# Patient Record
Sex: Male | Born: 1955
Health system: Southern US, Community
[De-identification: ages and names within clinical notes are randomized; demographics above are authoritative.]

## PROBLEM LIST (undated history)

## (undated) DIAGNOSIS — R251 Tremor, unspecified: Secondary | ICD-10-CM

## (undated) DIAGNOSIS — F1910 Other psychoactive substance abuse, uncomplicated: Secondary | ICD-10-CM

## (undated) DIAGNOSIS — Z87820 Personal history of traumatic brain injury: Secondary | ICD-10-CM

## (undated) DIAGNOSIS — Z8719 Personal history of other diseases of the digestive system: Secondary | ICD-10-CM

## (undated) DIAGNOSIS — Z86010 Personal history of colonic polyps: Secondary | ICD-10-CM

## (undated) DIAGNOSIS — F1010 Alcohol abuse, uncomplicated: Secondary | ICD-10-CM

## (undated) DIAGNOSIS — I10 Essential (primary) hypertension: Secondary | ICD-10-CM

## (undated) DIAGNOSIS — K759 Inflammatory liver disease, unspecified: Secondary | ICD-10-CM

## (undated) DIAGNOSIS — M5030 Other cervical disc degeneration, unspecified cervical region: Secondary | ICD-10-CM

## (undated) DIAGNOSIS — M1990 Unspecified osteoarthritis, unspecified site: Secondary | ICD-10-CM

## (undated) DIAGNOSIS — K648 Other hemorrhoids: Secondary | ICD-10-CM

## (undated) DIAGNOSIS — G8929 Other chronic pain: Secondary | ICD-10-CM

## (undated) DIAGNOSIS — T7840XA Allergy, unspecified, initial encounter: Secondary | ICD-10-CM

## (undated) DIAGNOSIS — Z8781 Personal history of (healed) traumatic fracture: Secondary | ICD-10-CM

## (undated) DIAGNOSIS — E785 Hyperlipidemia, unspecified: Secondary | ICD-10-CM

## (undated) DIAGNOSIS — Z973 Presence of spectacles and contact lenses: Secondary | ICD-10-CM

## (undated) DIAGNOSIS — F419 Anxiety disorder, unspecified: Secondary | ICD-10-CM

## (undated) DIAGNOSIS — K5790 Diverticulosis of intestine, part unspecified, without perforation or abscess without bleeding: Secondary | ICD-10-CM

## (undated) DIAGNOSIS — M545 Low back pain, unspecified: Secondary | ICD-10-CM

## (undated) DIAGNOSIS — S0990XA Unspecified injury of head, initial encounter: Secondary | ICD-10-CM

## (undated) DIAGNOSIS — R7989 Other specified abnormal findings of blood chemistry: Secondary | ICD-10-CM

## (undated) DIAGNOSIS — C61 Malignant neoplasm of prostate: Secondary | ICD-10-CM

## (undated) DIAGNOSIS — R945 Abnormal results of liver function studies: Secondary | ICD-10-CM

## (undated) HISTORY — PX: DENTAL SURGERY: SHX609

## (undated) HISTORY — PX: EYE SURGERY: SHX253

## (undated) HISTORY — DX: Other cervical disc degeneration, unspecified cervical region: M50.30

## (undated) HISTORY — DX: Hyperlipidemia, unspecified: E78.5

## (undated) HISTORY — DX: Allergy, unspecified, initial encounter: T78.40XA

## (undated) HISTORY — DX: Other psychoactive substance abuse, uncomplicated: F19.10

## (undated) HISTORY — PX: IRRIGATION AND DEBRIDEMENT SEBACEOUS CYST: SHX5255

## (undated) HISTORY — DX: Anxiety disorder, unspecified: F41.9

## (undated) HISTORY — DX: Unspecified osteoarthritis, unspecified site: M19.90

---

## 1977-07-17 DIAGNOSIS — Z87820 Personal history of traumatic brain injury: Secondary | ICD-10-CM

## 1977-07-17 HISTORY — DX: Personal history of traumatic brain injury: Z87.820

## 1977-10-05 DIAGNOSIS — S0990XA Unspecified injury of head, initial encounter: Secondary | ICD-10-CM

## 1977-10-05 HISTORY — DX: Unspecified injury of head, initial encounter: S09.90XA

## 2004-07-05 ENCOUNTER — Ambulatory Visit (HOSPITAL_BASED_OUTPATIENT_CLINIC_OR_DEPARTMENT_OTHER): Admission: RE | Admit: 2004-07-05 | Discharge: 2004-07-05 | Payer: Self-pay | Admitting: Orthopedic Surgery

## 2004-07-05 ENCOUNTER — Ambulatory Visit (HOSPITAL_COMMUNITY): Admission: RE | Admit: 2004-07-05 | Discharge: 2004-07-05 | Payer: Self-pay | Admitting: Orthopedic Surgery

## 2004-11-23 ENCOUNTER — Emergency Department (HOSPITAL_COMMUNITY): Admission: EM | Admit: 2004-11-23 | Discharge: 2004-11-23 | Payer: Self-pay | Admitting: Emergency Medicine

## 2005-08-28 ENCOUNTER — Emergency Department (HOSPITAL_COMMUNITY): Admission: EM | Admit: 2005-08-28 | Discharge: 2005-08-28 | Payer: Self-pay | Admitting: Emergency Medicine

## 2005-09-11 ENCOUNTER — Encounter: Admission: RE | Admit: 2005-09-11 | Discharge: 2005-09-11 | Payer: Self-pay | Admitting: Family Medicine

## 2007-10-22 ENCOUNTER — Encounter: Admission: RE | Admit: 2007-10-22 | Discharge: 2007-10-22 | Payer: Self-pay | Admitting: Family Medicine

## 2007-10-22 IMAGING — CR DG KNEE 1-2V*L*
3 series · 3 of 3 positions shown · non-contrast
Comparison: None

CLINICAL DATA: Chronic pain, no acute injury

LEFT KNEE - 1-2 VIEW

[view not recorded (1 of 3)]
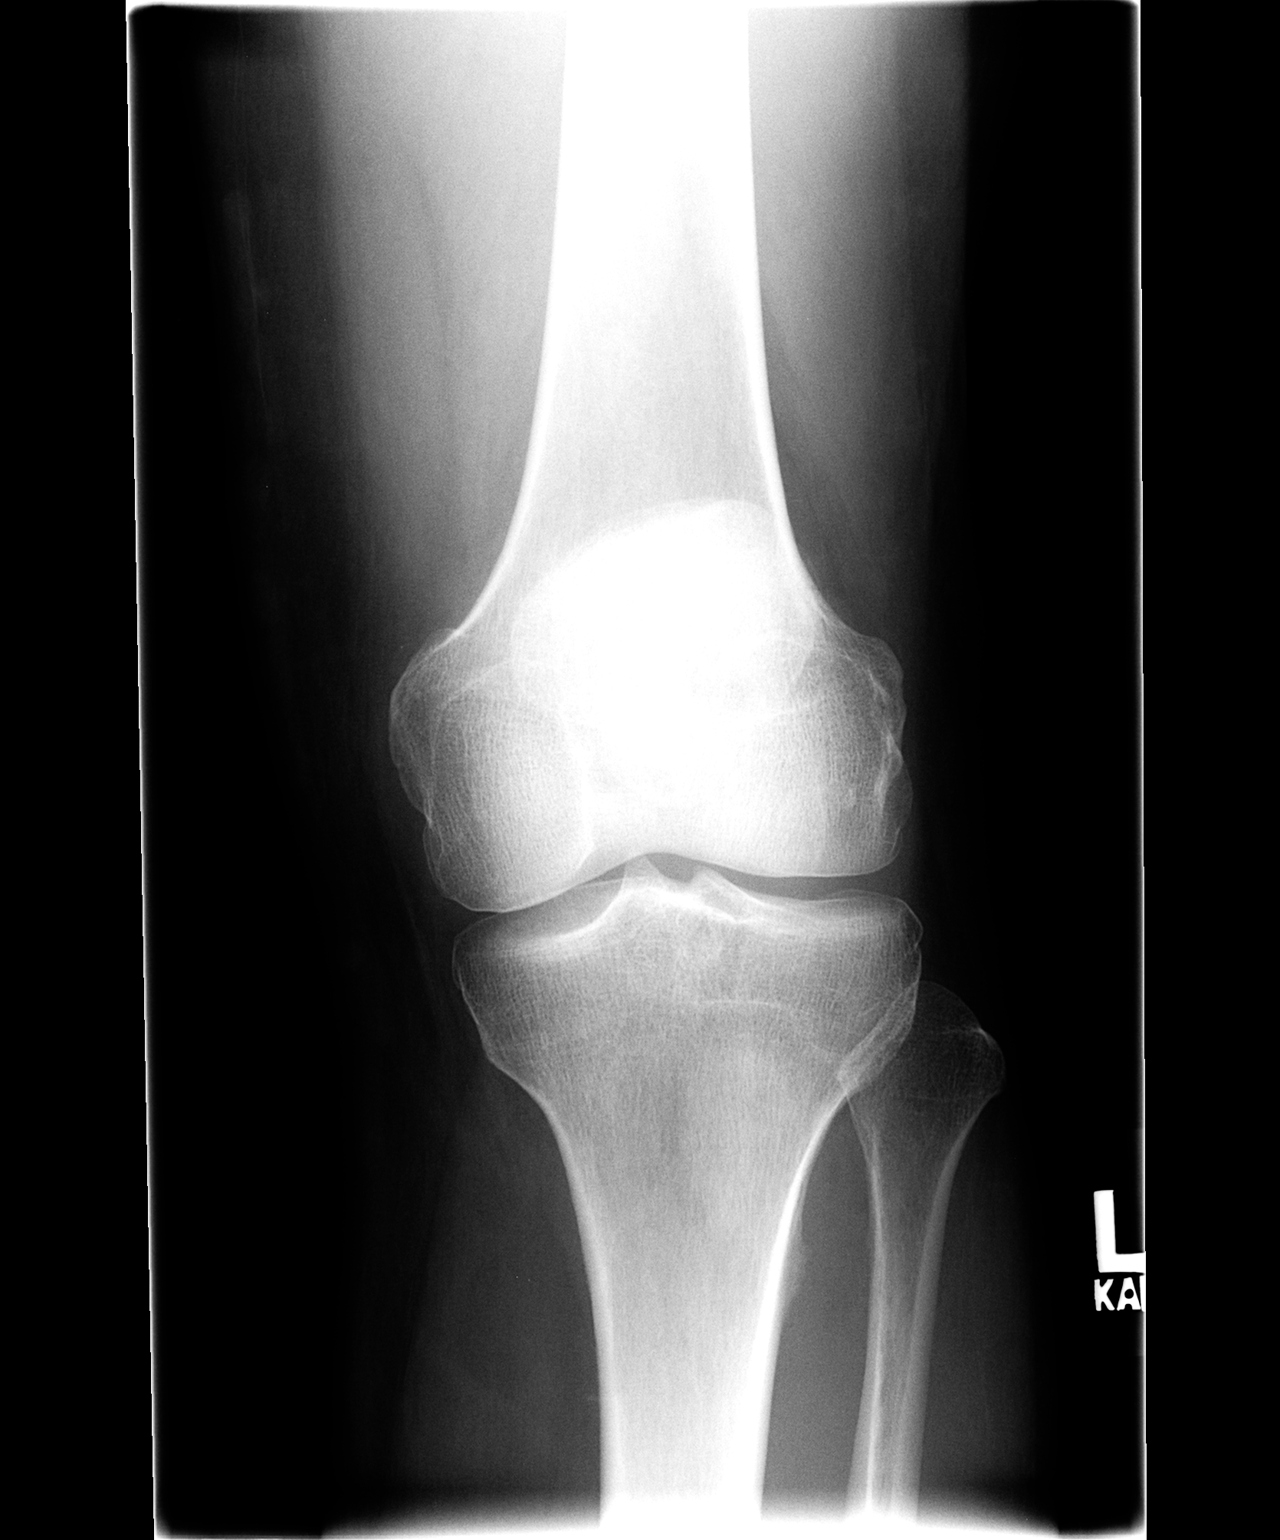

[view not recorded (2 of 3)]
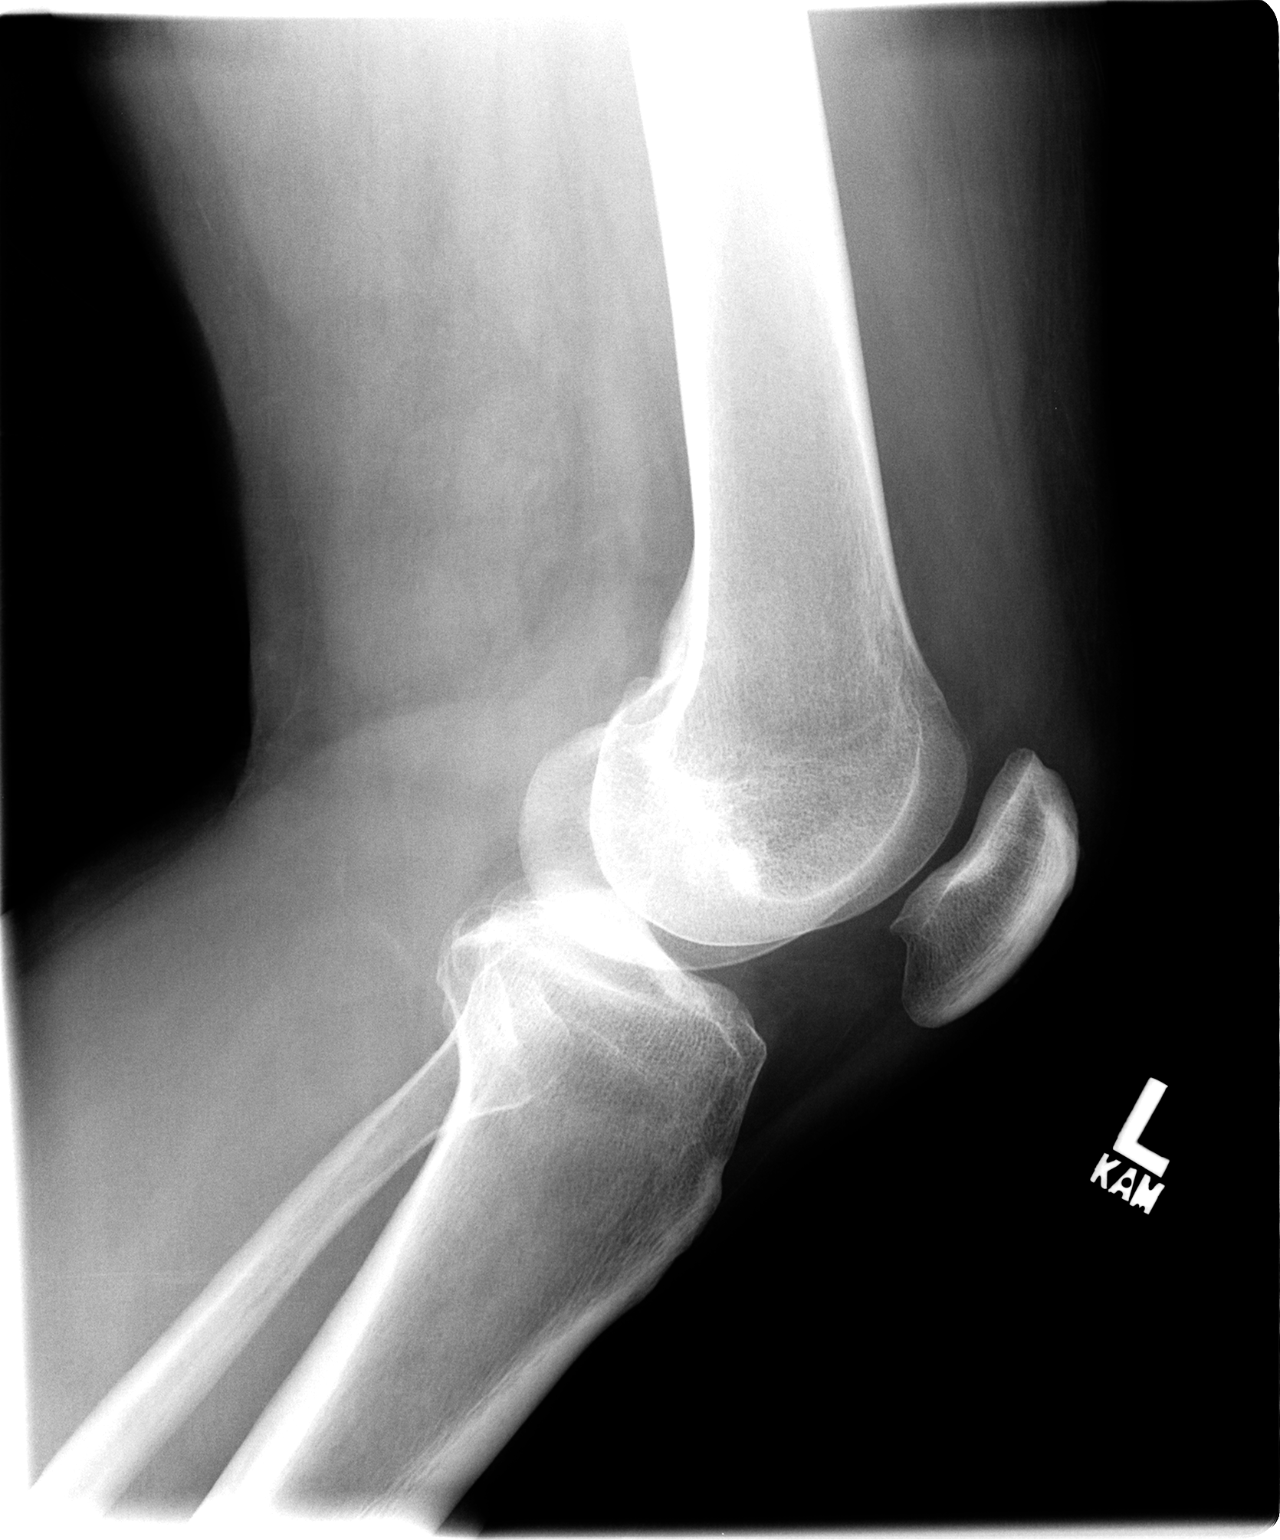

[view not recorded (3 of 3)]
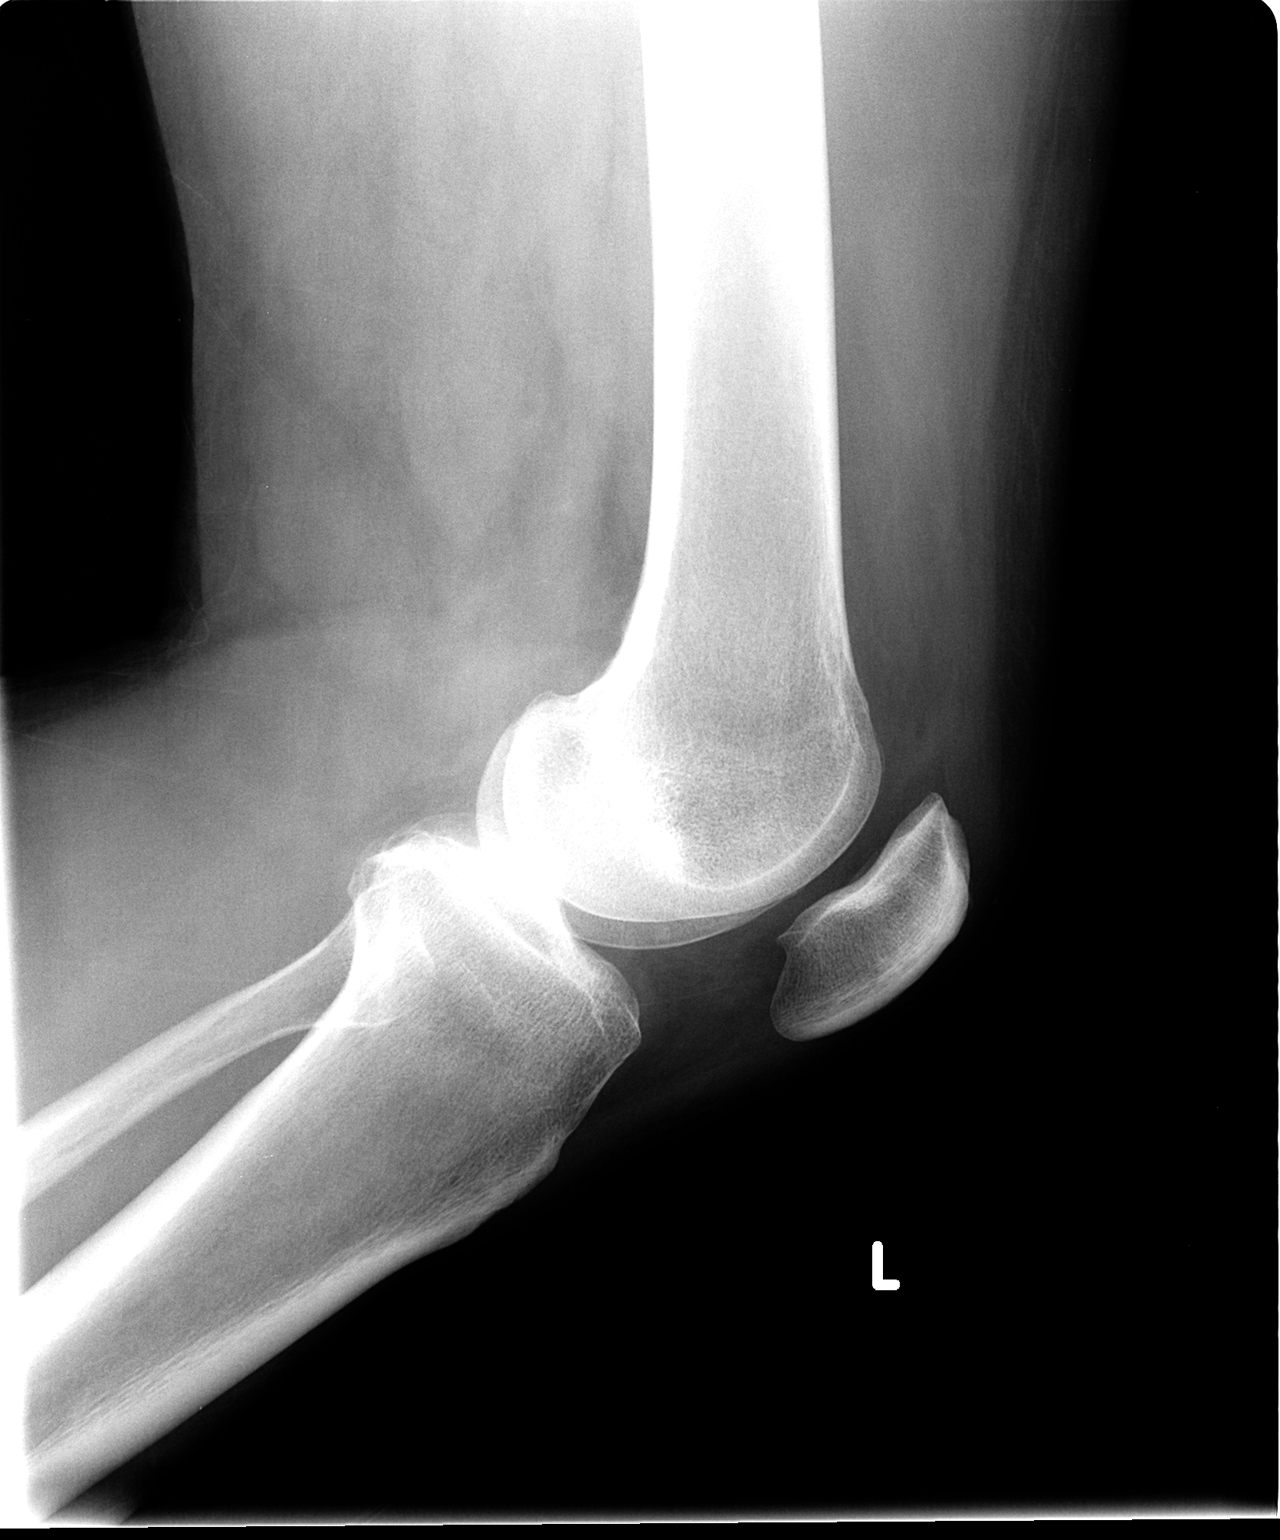

[3 of 3 positions shown; findings below may reference images not displayed]

FINDINGS: No acute fractures seen and no effusion is noted.  Joint
spaces are normal.
IMPRESSION: Negative.

## 2010-08-24 ENCOUNTER — Emergency Department (HOSPITAL_COMMUNITY)
Admission: EM | Admit: 2010-08-24 | Discharge: 2010-08-24 | Disposition: A | Discharge status: Home / Self Care | Payer: Self-pay | Attending: Emergency Medicine | Admitting: Emergency Medicine

## 2010-08-24 DIAGNOSIS — I10 Essential (primary) hypertension: Secondary | ICD-10-CM | POA: Insufficient documentation

## 2010-08-24 DIAGNOSIS — R0682 Tachypnea, not elsewhere classified: Secondary | ICD-10-CM | POA: Insufficient documentation

## 2010-08-24 DIAGNOSIS — F1010 Alcohol abuse, uncomplicated: Secondary | ICD-10-CM | POA: Insufficient documentation

## 2010-08-24 LAB — DIFFERENTIAL
Monocytes Absolute: 0.4 10*3/uL (ref 0.1–1.0)
Monocytes Relative: 6 % (ref 3–12)
Neutrophils Relative %: 57 % (ref 43–77)

## 2010-08-24 LAB — COMPREHENSIVE METABOLIC PANEL
ALT: 58 U/L — ABNORMAL HIGH (ref 0–53)
Albumin: 4.4 g/dL (ref 3.5–5.2)
BUN: 12 mg/dL (ref 6–23)
CO2: 26 mEq/L (ref 19–32)
GFR calc Af Amer: >60 mL/min (ref >60)
GFR calc non Af Amer: >60 mL/min (ref >60)
Glucose, Bld: 89 mg/dL (ref 70–99)

## 2010-08-24 LAB — CBC
HCT: 43.6 % (ref 39.0–52.0)
Hemoglobin: 15 g/dL (ref 13.0–17.0)
MCH: 34.2 pg — ABNORMAL HIGH (ref 26.0–34.0)
MCHC: 34.4 g/dL (ref 30.0–36.0)
RBC: 4.39 MIL/uL (ref 4.22–5.81)
WBC: 6.9 10*3/uL (ref 4.0–10.5)

## 2010-08-24 LAB — URINALYSIS, ROUTINE W REFLEX MICROSCOPIC
Ketones, ur: NEGATIVE mg/dL
Protein, ur: NEGATIVE mg/dL
Urine Glucose, Fasting: NEGATIVE mg/dL
pH: 6 (ref 5.0–8.0)

## 2010-08-24 LAB — RAPID URINE DRUG SCREEN, HOSP PERFORMED
Barbiturates: NOT DETECTED
Benzodiazepines: NOT DETECTED
Cocaine: NOT DETECTED
Tetrahydrocannabinol: NOT DETECTED

## 2010-08-24 LAB — ETHANOL: Alcohol, Ethyl (B): 65 mg/dL — ABNORMAL HIGH (ref 0–10)

## 2010-12-02 NOTE — Op Note (Signed)
NAMEMALIKE, FOGLIO              ACCOUNT NO.:  [id number]   MEDICAL RECORD NO.:  [id number]          PATIENT TYPE:  AMB   LOCATION:  DSC                          FACILITY:  [facility]   PHYSICIAN:  Katy Fitch. 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   OPERATION:  Release of left transverse carpal ligament.   SURGEON:  Katy Fitch. Sypher, M.D.   ASSISTANT:  Marveen Reeks. Dasnoit, P.A.C.   ANESTHESIA:  General by LMA.   SUPERVISING ANESTHESIOLOGIST:  Sheldon Silvan, M.D.   INDICATIONS FOR PROCEDURE:  [name] is a 55 year old gentleman with a  history of severe bilateral chronic carpal tunnel syndrome.  Electrodiagnostic studies performed in April of 2004 documented significant  bilateral carpal tunnel syndrome.   We have been treating him for the past 18 months with steroid injections and  splinting.  He has responded transiently but not permanently.  He has now  requested that we release his left transverse carpal ligament.   After informed consent he is brought to the operating room at this time.   DESCRIPTION OF PROCEDURE:  Erric Machnik is brought to the operating room  and placed in supine position on the operating table.  Following induction  of general anesthesia by LMA, the left arm was prepped with Betadine soap  and solution and sterilely draped.  Following exsanguination of the limb  with Esmarch bandage, an arterial tourniquet on proximal brachium was  inflated to 240 mmHg.   Procedure commenced with a short incision in line of the ring finger and the  palm.  Subcutaneous tissue was carefully divided over palmar fascia.  This  split longitudinally through the common sensory branch of the median  nerve.  These were followed back to the transverse carpal ligament which was  carefully isolated from the median nerve.  The ligament was released along  its ulnar border extending into the distal forearm.   This widely opened carpal canal.  No masses or other predicaments were  noted.  Bleeding points along the margin of the released ligament were  electrocauterized with bipolar current followed by repair of the skin with  intradermal 3-0 Prolene.  A compressive dressing was applied with volar  plaster splint, maintaining the wrist in five degrees dorsiflexion.   For aftercare, Mr. Dapolito was given a prescription for Vicodin 5 mg one or  two tablets p.o. q.4-6h. p.r.n. pain 20 tablets without refill.      Robe   RVS/MEDQ  D:  07/05/2004  T:  07/06/2004  Job:  [id number]

## 2013-01-22 ENCOUNTER — Ambulatory Visit (INDEPENDENT_AMBULATORY_CARE_PROVIDER_SITE_OTHER): Payer: BC Managed Care – PPO | Admitting: Family Medicine

## 2013-01-22 DIAGNOSIS — S0100XA Unspecified open wound of scalp, initial encounter: Secondary | ICD-10-CM

## 2013-01-22 DIAGNOSIS — F1020 Alcohol dependence, uncomplicated: Secondary | ICD-10-CM

## 2013-01-22 DIAGNOSIS — S0990XA Unspecified injury of head, initial encounter: Secondary | ICD-10-CM

## 2013-01-22 NOTE — Progress Notes (Signed)
Subjective: Patient drinks alcohol regularly. Last night he had a year and a couple of shots of liquor and a Xanax to help him go to bed. He does not sleep in the same bedroom with his wife. He awakened this morning with a wound on the left side of his scalp. He thinks medicine going to the bathroom and fallen.  Objective: Still can smell alcohol on him though he says he is not anything since last night. Approximately 1 inch wound in his left lateral scalp. He is alert and oriented. His TMs are normal. Eyes normal. Throat clear. Neck supple without nodes or tenderness. He has a bruise on his left upper arm. Chest clear. Heart regular without murmurs.  Assessment: Wound forehead Alcoholism  Plan: Told him Not stated benzodiazepines and alcohol together. Staples placed by a PA Think he is okay since it is been 12 hours or so since his head injury. No for the testing ordered.

## 2013-01-22 NOTE — Progress Notes (Signed)
   Patient ID: NESHAWN AIRD MRN: [id number], DOB: October 21, 1955, 57 y.o. Date of Encounter: 01/22/2013, 4:29 PM   PROCEDURE NOTE: Verbal consent obtained.  Risks and benefits of the procedure were explained. Patient made an informed decision to proceed with the procedure. Numbing: Anesthesia obtained with 2% lidocaine with epi 2 cc for local anesthesia.   Cleansed with soap and water by physician. Irrigated. Betadine prep per usual protocol.  Wound explored, no deep structures involved, no foreign bodies.   Wound repaired with # 6 staples.  Hemostasis obtained. Wound cleansed.  Wound care instructions including precautions covered with patient. Handout given.  Anticipate suture removal in 5 days.  SignedEula Listen, PA-C 01/22/2013 4:29 PM

## 2013-01-27 ENCOUNTER — Ambulatory Visit (INDEPENDENT_AMBULATORY_CARE_PROVIDER_SITE_OTHER): Payer: BC Managed Care – PPO | Admitting: Physician Assistant

## 2013-01-27 VITALS — BP 132/78 | Temp 98.7°F

## 2013-01-27 DIAGNOSIS — Z5189 Encounter for other specified aftercare: Secondary | ICD-10-CM

## 2013-01-27 DIAGNOSIS — S0101XD Laceration without foreign body of scalp, subsequent encounter: Secondary | ICD-10-CM

## 2013-01-27 NOTE — Progress Notes (Signed)
   [address], [place] [address]   Phone [phone]  Subjective:    Patient ID: [name], male    DOB: 28-Nov-1955, 57 y.o.   MRN: [id number]  HPI Pt presents to clinic for suture removal.  He has some slight TTP around the area.   Review of Systems     Objective:   Physical Exam  Vitals reviewed. Constitutional: He is oriented to person, place, and time. He appears well-developed and well-nourished.  HENT:  Head: Normocephalic and atraumatic.  Right Ear: External ear normal.  Left Ear: External ear normal.  Pulmonary/Chest: Effort normal.  Neurological: He is alert and oriented to person, place, and time.  Skin: Skin is warm and dry.  #6 staples removed without difficulty. Still some swelling around the wound but no signs of infection.  Scab in place.  Psychiatric: He has a normal mood and affect. His behavior is normal. Judgment and thought content normal.          Assessment & Plan:  Wound head - sutures removed without difficulty - would care d/w pt  Benny Lennert PA-C 01/27/2013 3:08 PM

## 2013-02-08 ENCOUNTER — Other Ambulatory Visit (HOSPITAL_COMMUNITY): Payer: Self-pay | Admitting: Family Medicine

## 2013-02-08 ENCOUNTER — Ambulatory Visit (HOSPITAL_COMMUNITY)
Admission: RE | Admit: 2013-02-08 | Discharge: 2013-02-08 | Disposition: A | Discharge status: Home / Self Care | Payer: BC Managed Care – PPO | Source: Ambulatory Visit | Attending: Family Medicine | Admitting: Family Medicine

## 2013-02-08 DIAGNOSIS — R52 Pain, unspecified: Secondary | ICD-10-CM

## 2013-02-08 DIAGNOSIS — R609 Edema, unspecified: Secondary | ICD-10-CM | POA: Insufficient documentation

## 2013-02-08 IMAGING — CR DG FINGER INDEX 2+V*L*
3 series · 3 of 3 positions shown · non-contrast
Comparison: None.

CLINICAL DATA: Pain and swelling; history of fungal infection

LEFT INDEX FINGER 2+V

[x finger pa left]
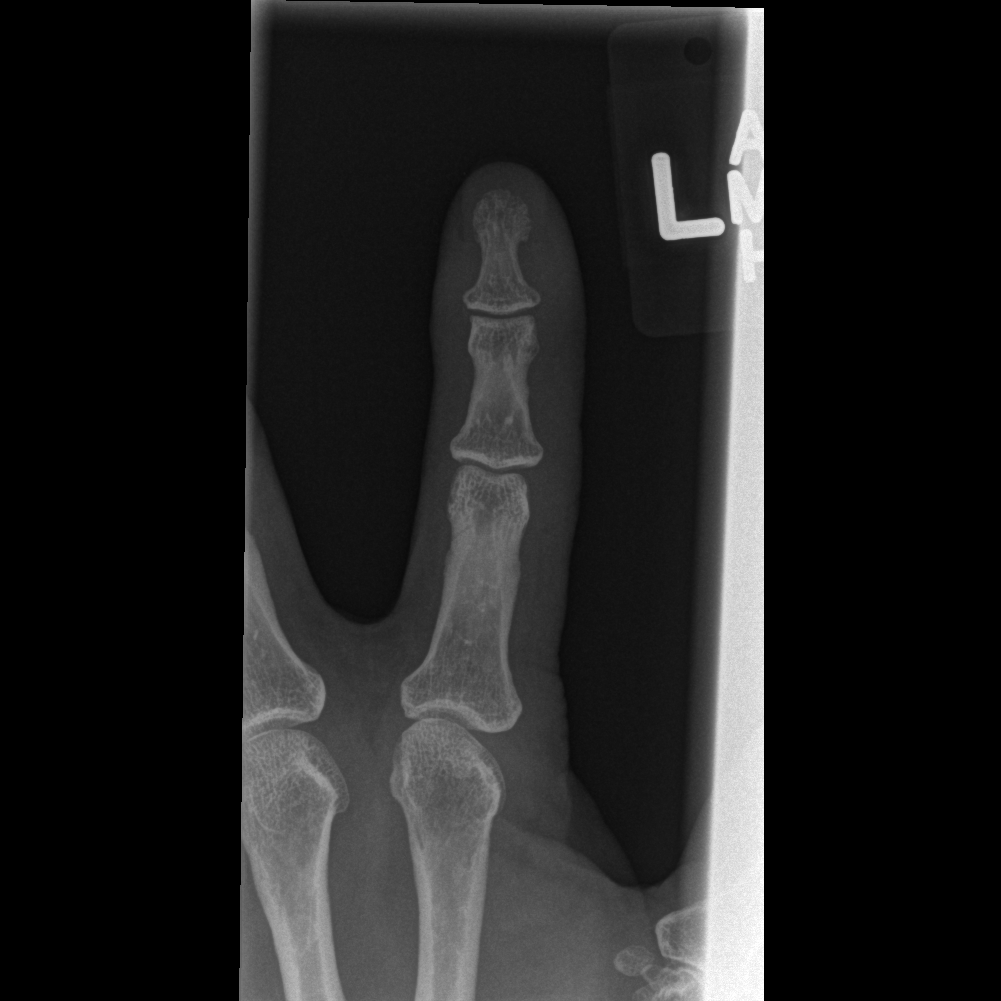

[x finger obl. left]
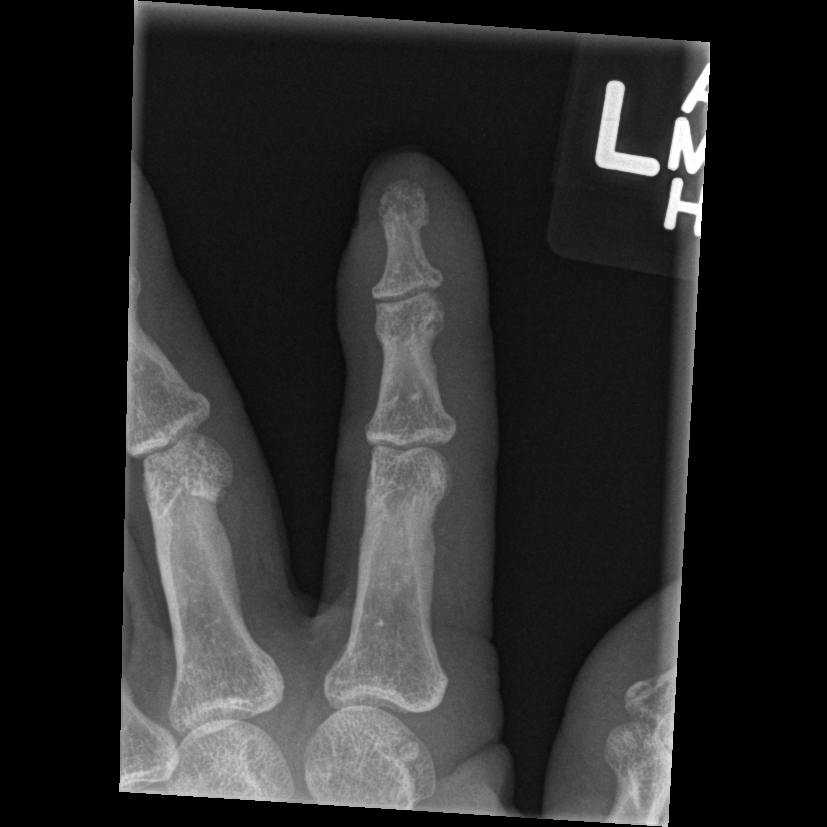

[x finger lateral left]
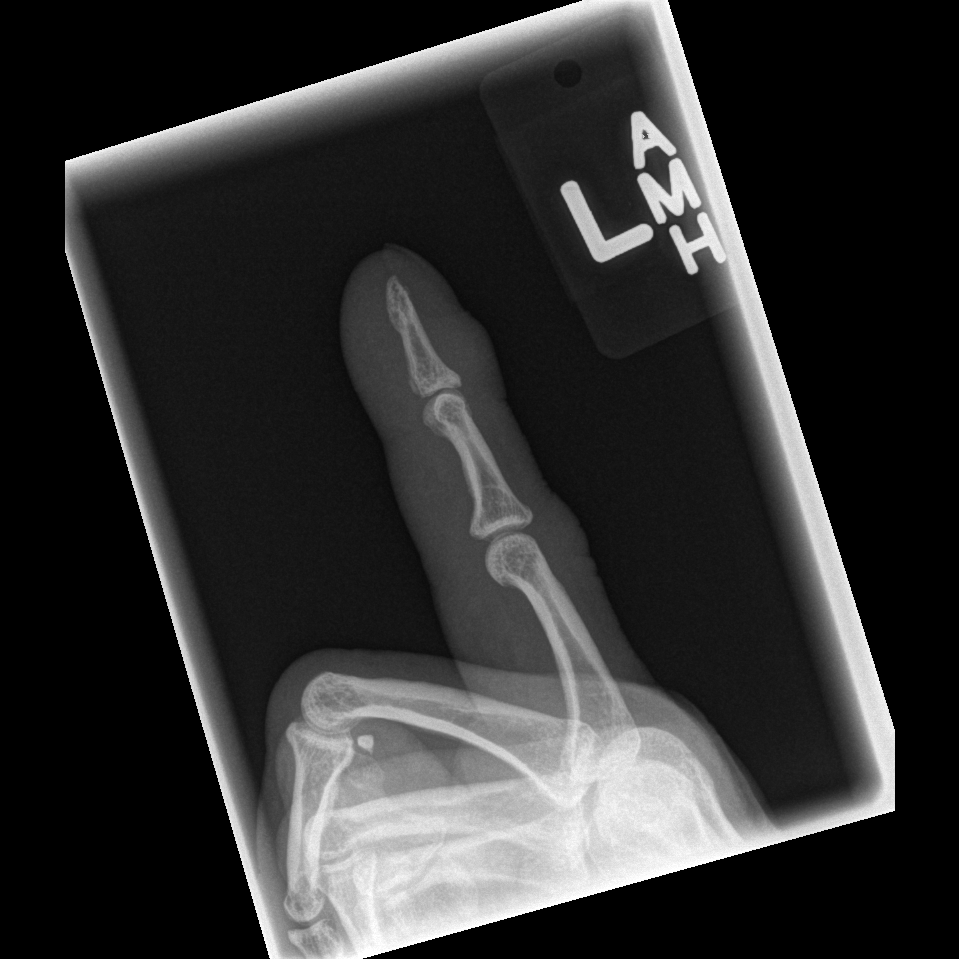

[3 of 3 positions shown; findings below may reference images not displayed]

FINDINGS: Frontal, oblique, and lateral views were obtained.  There
is swelling dorsally at the level of the proximal portion of the
second distal phalanx.  No radiopaque foreign body or soft tissue
air.

No erosive change or bony destruction.  No fracture or dislocation.
Joint spaces appear intact.
IMPRESSION: Soft tissue swelling dorsally.  No fracture or radiopaque foreign
body.  No erosive change or bony destruction.

## 2013-02-09 ENCOUNTER — Ambulatory Visit (INDEPENDENT_AMBULATORY_CARE_PROVIDER_SITE_OTHER): Payer: BC Managed Care – PPO | Admitting: Emergency Medicine

## 2013-02-09 DIAGNOSIS — F1920 Other psychoactive substance dependence, uncomplicated: Secondary | ICD-10-CM | POA: Insufficient documentation

## 2013-02-09 DIAGNOSIS — L03019 Cellulitis of unspecified finger: Secondary | ICD-10-CM

## 2013-02-09 DIAGNOSIS — M25549 Pain in joints of unspecified hand: Secondary | ICD-10-CM

## 2013-02-09 DIAGNOSIS — L02519 Cutaneous abscess of unspecified hand: Secondary | ICD-10-CM

## 2013-02-09 MED ORDER — CIPROFLOXACIN HCL 500 MG PO TABS: 500.0000 mg | ORAL_TABLET | Freq: Two times a day (BID) | ORAL | Status: DC

## 2013-02-09 NOTE — Progress Notes (Signed)
  Subjective:    Patient ID: [name], male    DOB: April 07, 1956, 57 y.o.   MRN: [id number]  HPI 57y.o. Male here today with left 2nd finger infection. States this has been going on for the past few days. Was seen at [facility] and given two shots.   Patient also went to [facility] on Sat 7/26   Review of Systems     Objective:   Physical Exam examination of the right index finger reveals periodic material beneath the nail of his finger. There is significant paronychia present at the base of the nail plate. He does have a flexion extension at the DIP joint. Films were done yesterday at [facility]  these were normal.        Assessment & Plan:  Patient did have an odor of old alcohol in the office. I did talk with him about possibly getting into a treatment program. He has been through detox before in [place]. I told him if he ever wanted to come in and talk about treatment programs I would be more than happy to sit down with him and discuss this with him.

## 2013-02-09 NOTE — Progress Notes (Signed)
   Patient ID: [name] MRN: [id number], DOB: 21-Jun-1956, 57 y.o. Date of Encounter: 02/09/2013, 2:56 PM   PROCEDURE NOTE: Verbal consent obtained.  Risks and benefits explained.  Patient made informed decision under sound mind and judgement. Sterile technique employed. Numbing: Anesthesia obtained with metacarpal block of 2% lidocaine plain.  Betadine prep per usual protocol.  Entire nail lifted without difficulty and removed in total. Proximal aspect of nail bed explored revealing no nail remnants.  Copious amount of purulence expressed.  No active bleeding. Xeroform dressing applied. Wound care instructions including precautions covered with patient. Handout given.  Return in 48 hours for recheck.   Grier Mitts, PA-C 02/09/2013 2:56 PM

## 2013-02-11 ENCOUNTER — Ambulatory Visit (INDEPENDENT_AMBULATORY_CARE_PROVIDER_SITE_OTHER): Payer: BC Managed Care – PPO | Admitting: Physician Assistant

## 2013-02-11 VITALS — BP 120/70 | HR 86 | Temp 98.0°F | Resp 16 | Ht 64.0 in | Wt 160.0 lb

## 2013-02-11 DIAGNOSIS — IMO0002 Reserved for concepts with insufficient information to code with codable children: Secondary | ICD-10-CM

## 2013-02-11 DIAGNOSIS — L03012 Cellulitis of left finger: Secondary | ICD-10-CM

## 2013-02-11 NOTE — Progress Notes (Signed)
   [address], [place] [address]   Phone [phone]  Subjective:    Patient ID: [name], male    DOB: 1955-10-07, 57 y.o.   MRN: [id number]  HPI Pt presents to clinic for wound recheck.  He is doing ok.  Tolerating the abx ok.  He is changing the drsg once daily.  No fevers or chills - he feels fine.   Review of Systems     Objective:   Physical Exam  Vitals reviewed. Constitutional: He is oriented to person, place, and time. He appears well-developed and well-nourished.  HENT:  Head: Normocephalic and atraumatic.  Right Ear: External ear normal.  Left Ear: External ear normal.  Pulmonary/Chest: Effort normal.  Neurological: He is alert and oriented to person, place, and time.  Skin: Skin is warm and dry.  Drsg removed.  Skin macerated at the nail fold - but no erythema or signs of infection.  Xeroform gauze in place.  Psychiatric: He has a normal mood and affect. His behavior is normal. Judgment and thought content normal.   Results for orders placed in visit on 02/09/13  WOUND CULTURE      Result Value Range   GRAM STAIN No WBC Seen     GRAM STAIN No Squamous Epithelial Cells Seen     GRAM STAIN No Organisms Seen     Preliminary Report Culture reincubated for better growth         Assessment & Plan:  Paronychia, left - d/w pt no bacteria but still should complete abx.  Pt to change drsg as soon as it becomes wet due to maceration - No f/u needed if continuing to heal.  Benny Lennert PA-C 02/11/2013 4:32 PM

## 2013-02-14 DIAGNOSIS — Z8781 Personal history of (healed) traumatic fracture: Secondary | ICD-10-CM

## 2013-02-14 HISTORY — DX: Personal history of (healed) traumatic fracture: Z87.81

## 2013-02-14 LAB — WOUND CULTURE: Gram Stain: NONE SEEN

## 2013-03-04 ENCOUNTER — Ambulatory Visit: Payer: BC Managed Care – PPO

## 2013-03-04 ENCOUNTER — Ambulatory Visit (INDEPENDENT_AMBULATORY_CARE_PROVIDER_SITE_OTHER): Payer: BC Managed Care – PPO | Admitting: Internal Medicine

## 2013-03-04 VITALS — BP 142/88 | HR 96 | Temp 98.3°F | Resp 20 | Ht 63.0 in | Wt 169.6 lb

## 2013-03-04 DIAGNOSIS — S90129A Contusion of unspecified lesser toe(s) without damage to nail, initial encounter: Secondary | ICD-10-CM

## 2013-03-04 DIAGNOSIS — Z7189 Other specified counseling: Secondary | ICD-10-CM

## 2013-03-04 DIAGNOSIS — R109 Unspecified abdominal pain: Secondary | ICD-10-CM

## 2013-03-04 DIAGNOSIS — R079 Chest pain, unspecified: Secondary | ICD-10-CM

## 2013-03-04 DIAGNOSIS — S2249XA Multiple fractures of ribs, unspecified side, initial encounter for closed fracture: Secondary | ICD-10-CM

## 2013-03-04 DIAGNOSIS — M79609 Pain in unspecified limb: Secondary | ICD-10-CM

## 2013-03-04 DIAGNOSIS — M79675 Pain in left toe(s): Secondary | ICD-10-CM

## 2013-03-04 DIAGNOSIS — R0781 Pleurodynia: Secondary | ICD-10-CM

## 2013-03-04 DIAGNOSIS — Z7141 Alcohol abuse counseling and surveillance of alcoholic: Secondary | ICD-10-CM

## 2013-03-04 DIAGNOSIS — S2242XA Multiple fractures of ribs, left side, initial encounter for closed fracture: Secondary | ICD-10-CM

## 2013-03-04 DIAGNOSIS — R319 Hematuria, unspecified: Secondary | ICD-10-CM

## 2013-03-04 DIAGNOSIS — S90122A Contusion of left lesser toe(s) without damage to nail, initial encounter: Secondary | ICD-10-CM

## 2013-03-04 LAB — POCT UA - MICROSCOPIC ONLY: Mucus, UA: NEGATIVE

## 2013-03-04 LAB — POCT URINALYSIS DIPSTICK
Nitrite, UA: NEGATIVE
Protein, UA: NEGATIVE

## 2013-03-04 LAB — COMPREHENSIVE METABOLIC PANEL
ALT: 83 U/L — ABNORMAL HIGH (ref 0–53)
AST: 142 U/L — ABNORMAL HIGH (ref 0–37)
Albumin: 4.9 g/dL (ref 3.5–5.2)
Alkaline Phosphatase: 98 U/L (ref 39–117)
CO2: 26 mEq/L (ref 19–32)
Calcium: 9.7 mg/dL (ref 8.4–10.5)
Creat: 0.83 mg/dL (ref 0.50–1.35)
Total Bilirubin: 0.7 mg/dL (ref 0.3–1.2)
Total Protein: 8 g/dL (ref 6.0–8.3)

## 2013-03-04 LAB — POCT CBC
Granulocyte percent: 65 %G (ref 37–80)
HCT, POC: 48.7 % (ref 43.5–53.7)
Lymph, poc: 1.6 (ref 0.6–3.4)
MCH, POC: 34.1 pg — AB (ref 27–31.2)
MCV: 105.8 fL — AB (ref 80–97)
MID (cbc): 0.5 (ref 0–0.9)
POC LYMPH PERCENT: 27 %L (ref 10–50)
RBC: 4.6 M/uL — AB (ref 4.69–6.13)
WBC: 6.1 10*3/uL (ref 4.6–10.2)

## 2013-03-04 LAB — LIPASE: Lipase: 60 U/L (ref 0–75)

## 2013-03-04 LAB — GAMMA GT: GGT: 249 U/L — ABNORMAL HIGH (ref 7–51)

## 2013-03-04 IMAGING — CR DG TOE 5TH 2+V*L*
1 series · 1 of 1 positions shown · non-contrast
Comparison: None.

CLINICAL DATA: Left fifth toe pain after injury.

DG TOE 5TH LEFT

[AP]
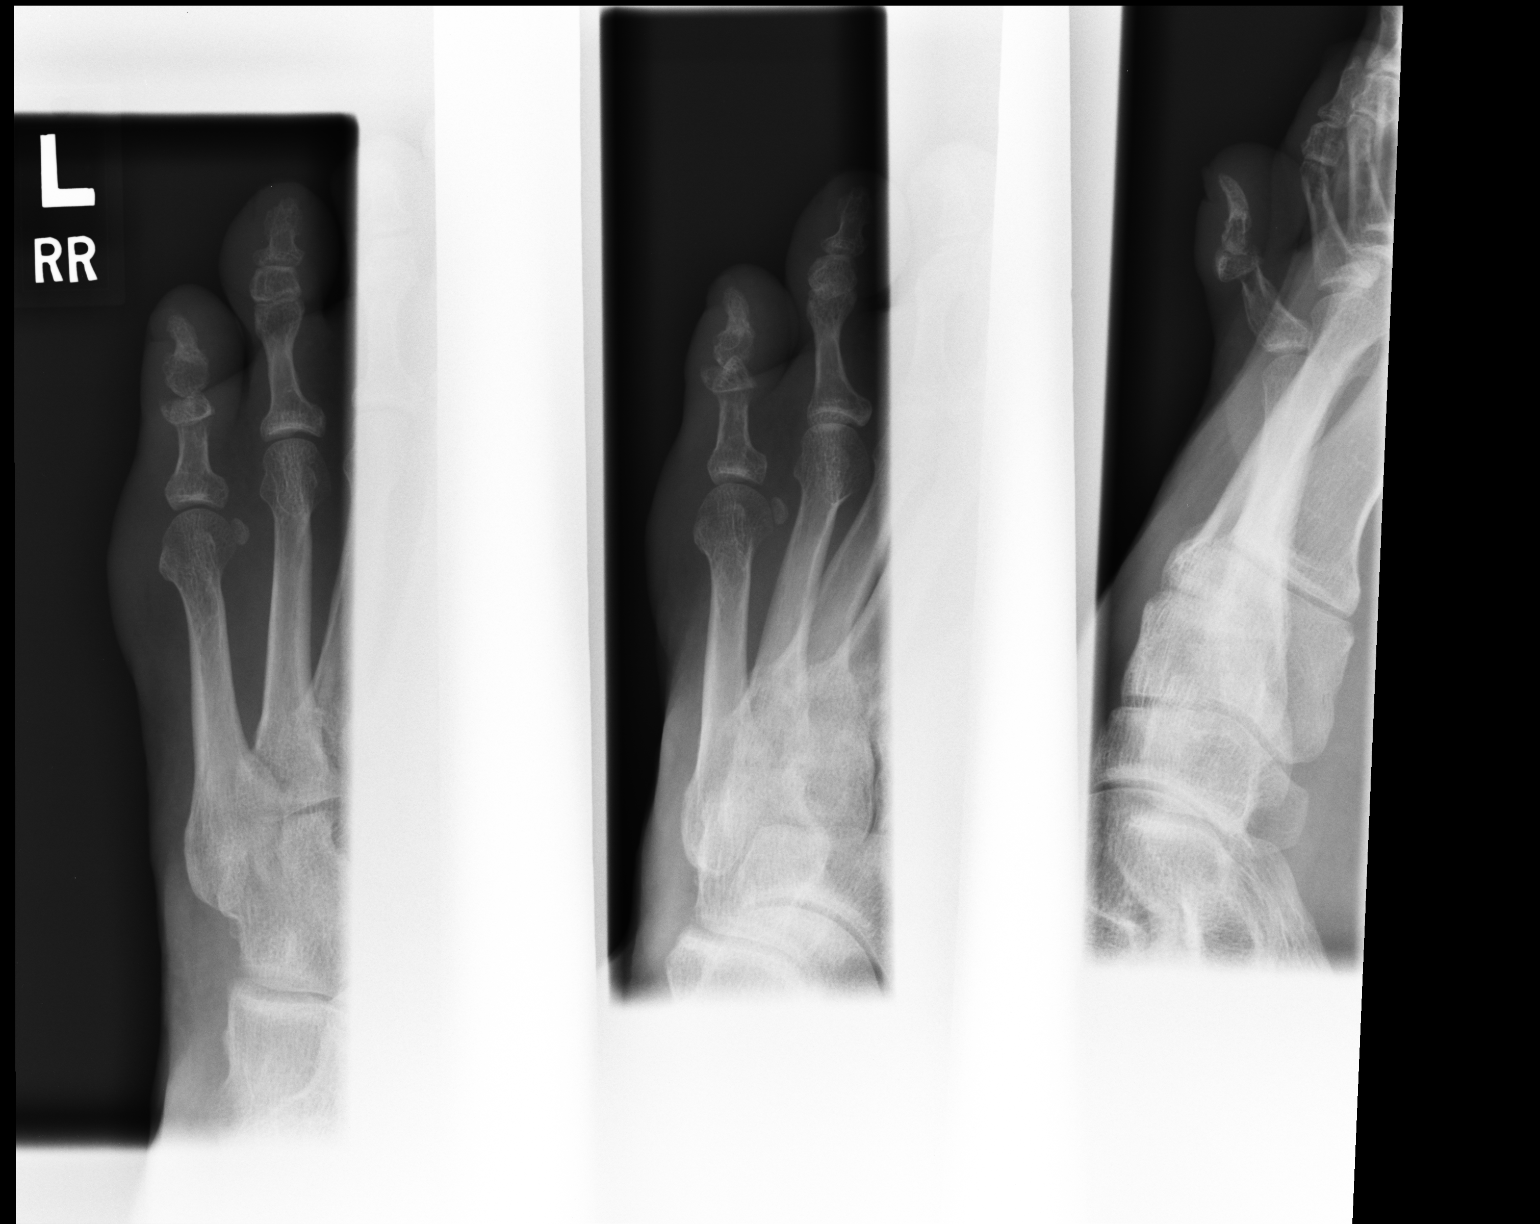

[1 of 1 positions shown; findings below may reference images not displayed]

FINDINGS: Deformity of the distal portion of the fifth proximal
phalanx is noted consistent with displaced fracture of
indeterminate age.  No radiopaque foreign body is noted.
IMPRESSION: Displaced fracture involving distal portion of the fifth proximal
phalanx of indeterminate age.

Clinically significant discrepancy from primary report, if
provided: None

## 2013-03-04 IMAGING — CR DG RIBS W/ CHEST 3+V*L*
3 series · 3 of 3 positions shown · non-contrast
Comparison: None.

CLINICAL DATA: Left-sided rib pain

LEFT RIBS AND CHEST - 3+ VIEW

[PA]
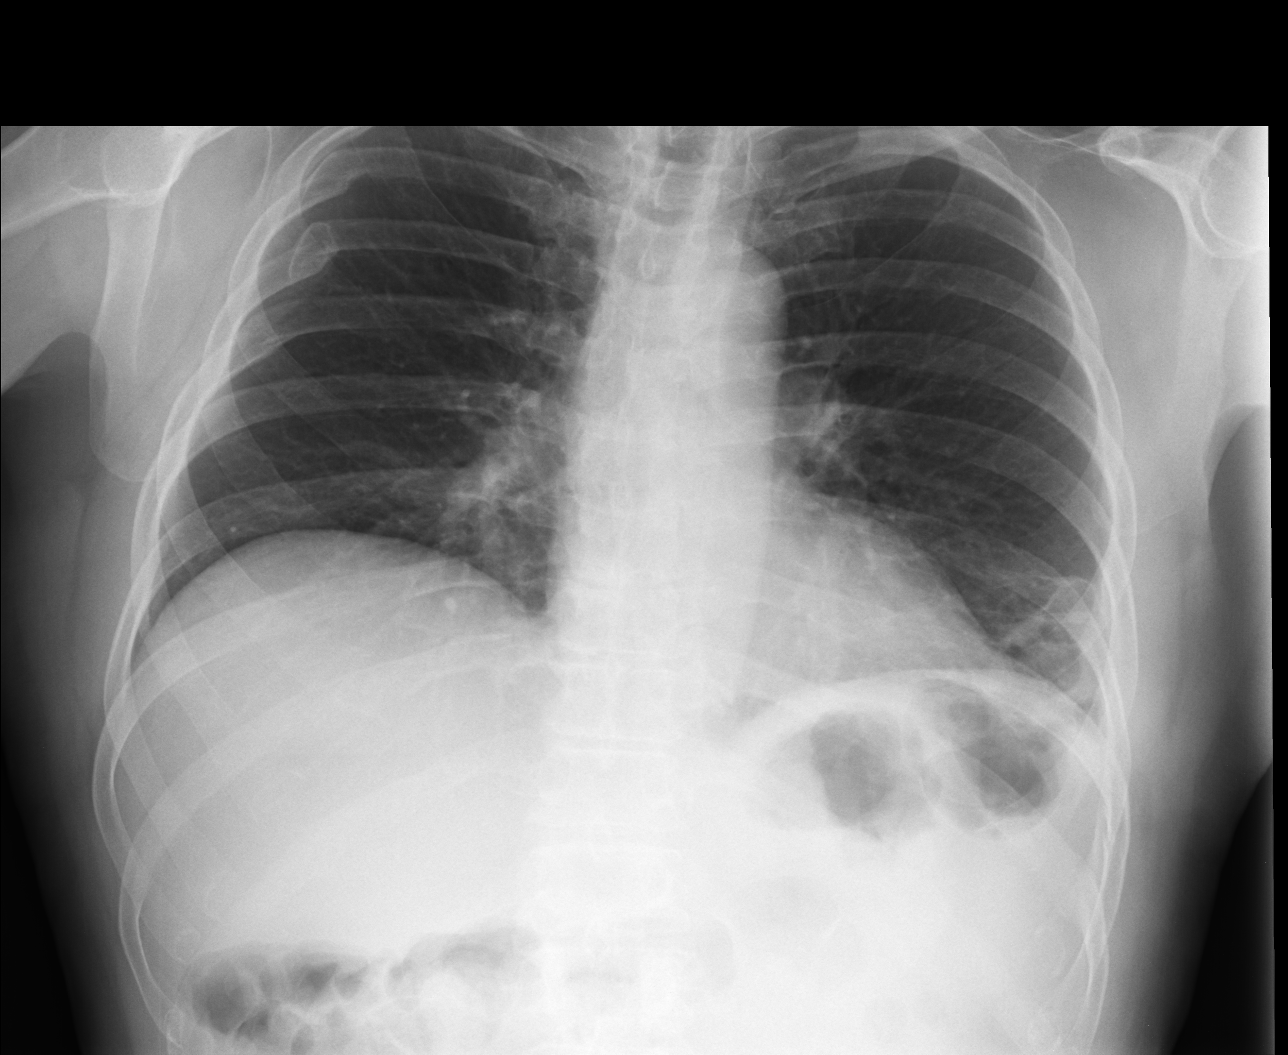

[rao]
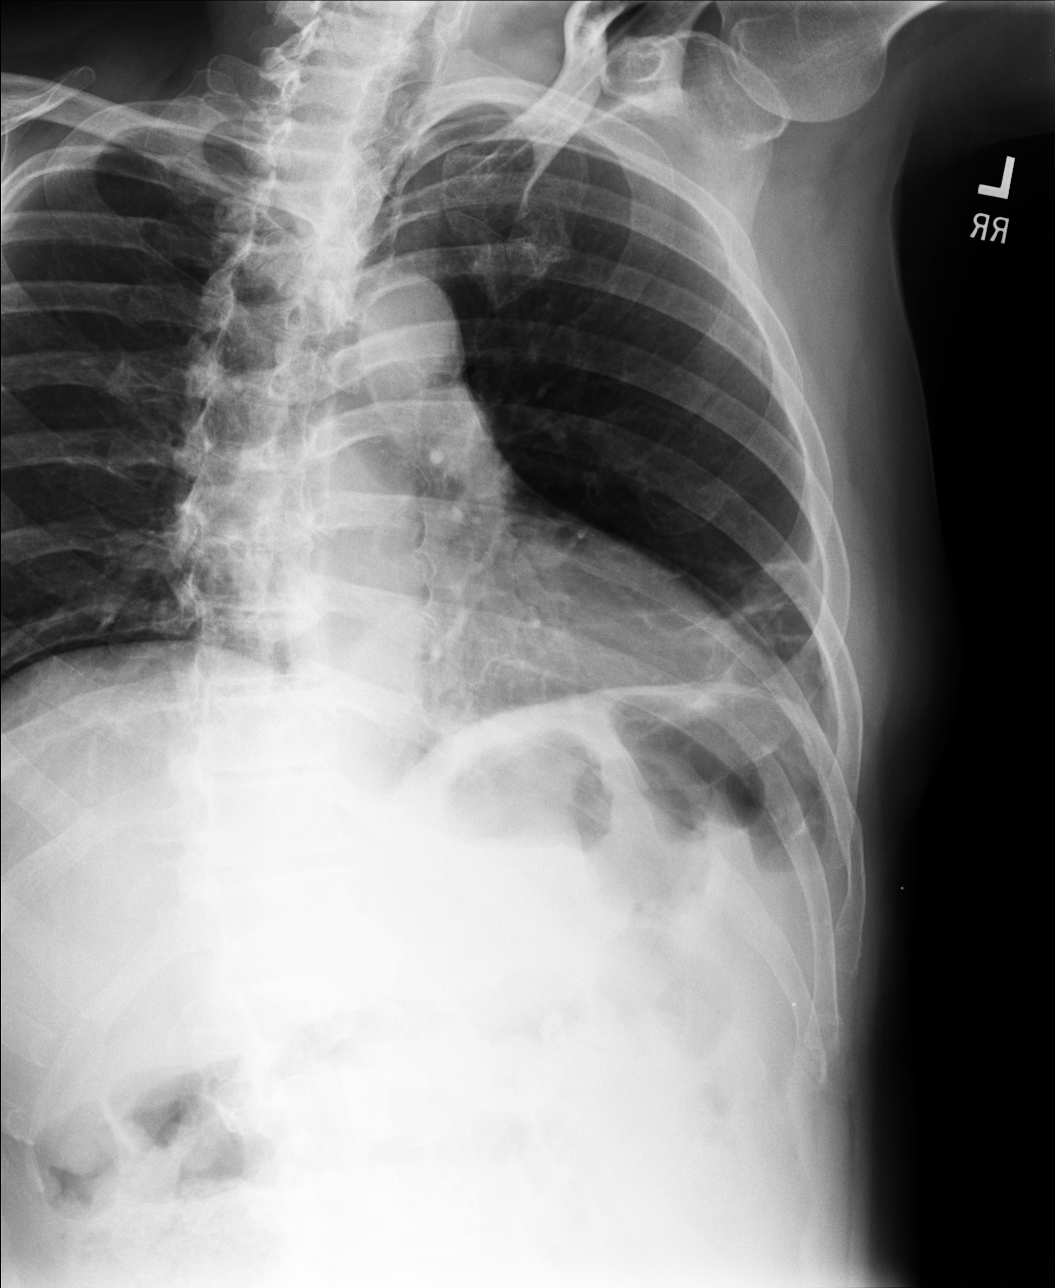

[lao]
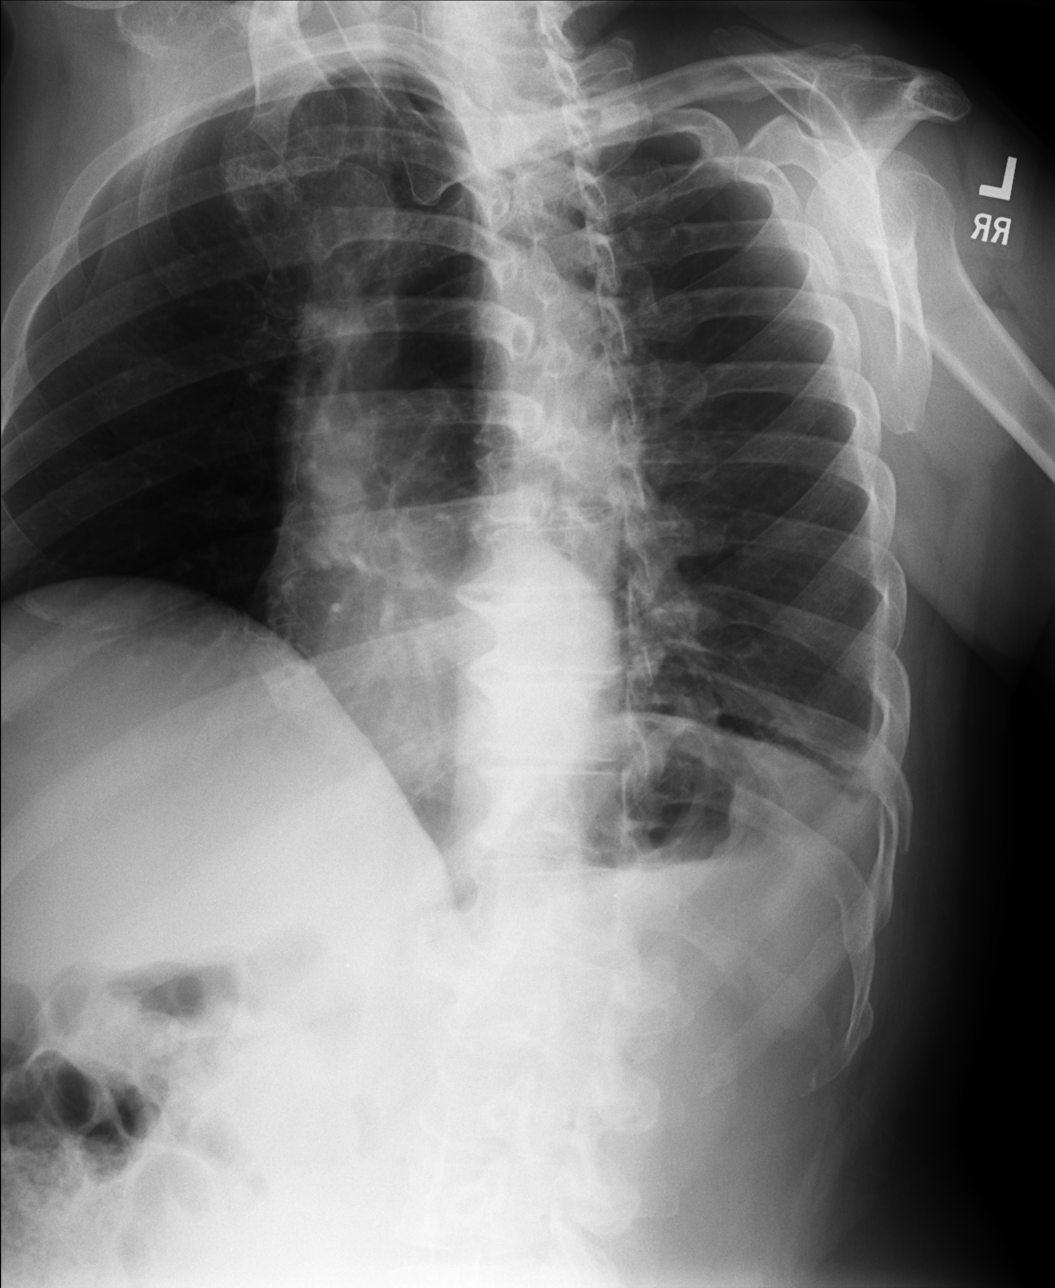

[3 of 3 positions shown; findings below may reference images not displayed]

FINDINGS: Cardiomediastinal silhouette appears normal.  Old right
rib fractures are noted.  Right lung is clear.  No pneumothorax is
noted.  There is seen ill-defined opacities laterally in left lung
base most consistent with subsegmental atelectasis.  Mildly
displaced acute fracture involving the lateral portion of left
tenth rib is noted.
IMPRESSION: Mildly displaced acute fracture of left tenth rib.  Focus of
subsegmental atelectasis is seen laterally in the left lung base.

Clinically significant discrepancy from primary report, if
provided: None

## 2013-03-04 MED ORDER — HYDROCODONE-ACETAMINOPHEN 5-325 MG PO TABS: 1.0000 | ORAL_TABLET | Freq: Four times a day (QID) | ORAL | Status: DC | PRN

## 2013-03-04 MED ORDER — CIPROFLOXACIN HCL 500 MG PO TABS: 500.0000 mg | ORAL_TABLET | Freq: Two times a day (BID) | ORAL | Status: DC

## 2013-03-04 NOTE — Patient Instructions (Addendum)
Hematuria, Adult Hematuria (blood in your urine) can be caused by a bladder infection (cystitis), kidney infection (pyelonephritis), prostate infection (prostatitis), or kidney stone. Infections will usually respond to antibiotics (medications which kill germs), and a kidney stone will usually pass through your urine without further treatment. If you were put on antibiotics, take all the medicine until gone. You may feel better in a few days, but take all of your medicine or the infection may not respond and become more difficult to treat. If antibiotics were not given, an infection did not cause the blood in the urine. A further work up to find out the reason may be needed. HOME CARE INSTRUCTIONS   Drink lots of fluid, 3 to 4 quarts a day. If you have been diagnosed with an infection, cranberry juice is especially recommended, in addition to large amounts of water.  Avoid caffeine, tea, and carbonated beverages, because they tend to irritate the bladder.  Avoid alcohol as it may irritate the prostate.  Only take over-the-counter or prescription medicines for pain, discomfort, or fever as directed by your caregiver.  If you have been diagnosed with a kidney stone follow your caregivers instructions regarding straining your urine to catch the stone. TO PREVENT FURTHER INFECTIONS:  Empty the bladder often. Avoid holding urine for long periods of time.  After a bowel movement, women should cleanse front to back. Use each tissue only once.  Empty the bladder before and after sexual intercourse if you are a male.  Return to your caregiver if you develop back pain, fever, nausea (feeling sick to your stomach), vomiting, or your symptoms (problems) are not better in 3 days. Return sooner if you are getting worse. If you have been requested to return for further testing make sure to keep your appointments. If an infection is not the cause of blood in your urine, X-rays may be required. Your caregiver  will discuss this with you. SEEK IMMEDIATE MEDICAL CARE IF:   You have a persistent fever over 102 F (38.9 C).  You develop severe vomiting and are unable to keep the medication down.  You develop severe back or abdominal pain despite taking your medications.  You begin passing a large amount of blood or clots in your urine.  You feel extremely weak or faint, or pass out. MAKE SURE YOU:   Understand these instructions.  Will watch your condition.  Will get help right away if you are not doing well or get worse. Document Released: 07/03/2005 Document Revised: 09/25/2011 Document Reviewed: 02/20/2008 [facility] Patient Information 2014 [place], [place]. Alcohol Problems Most adults who drink alcohol drink in moderation (not a lot) are at low risk for developing problems related to their drinking. However, all drinkers, including low-risk drinkers, should know about the health risks connected with drinking alcohol. RECOMMENDATIONS FOR LOW-RISK DRINKING  Drink in moderation. Moderate drinking is defined as follows:   Men - no more than 2 drinks per day.  Nonpregnant women - no more than 1 drink per day.  Over age 51 - no more than 1 drink per day. A standard drink is 12 grams of pure alcohol, which is equal to a 12 ounce bottle of beer or wine cooler, a 5 ounce glass of wine, or 1.5 ounces of distilled spirits (such as whiskey, brandy, vodka, or rum).  ABSTAIN FROM (DO NOT DRINK) ALCOHOL:  When pregnant or considering pregnancy.  When taking a medication that interacts with alcohol.  If you are alcohol dependent.  A medical  condition that prohibits drinking alcohol (such as ulcer, liver disease, or heart disease). DISCUSS WITH YOUR CAREGIVER:  If you are at risk for coronary heart disease, discuss the potential benefits and risks of alcohol use: Light to moderate drinking is associated with lower rates of coronary heart disease in certain populations (for example, men over age  90 and postmenopausal women). Infrequent or nondrinkers are advised not to begin light to moderate drinking to reduce the risk of coronary heart disease so as to avoid creating an alcohol-related problem. Similar protective effects can likely be gained through proper diet and exercise.  Women and the elderly have smaller amounts of body water than men. As a result women and the elderly achieve a higher blood alcohol concentration after drinking the same amount of alcohol.  Exposing a fetus to alcohol can cause a broad range of birth defects referred to as Fetal Alcohol Syndrome (FAS) or Alcohol-Related Birth Defects (ARBD). Although FAS/ARBD is connected with excessive alcohol consumption during pregnancy, studies also have reported neurobehavioral problems in infants born to mothers reporting drinking an average of 1 drink per day during pregnancy.  Heavier drinking (the consumption of more than 4 drinks per occasion by men and more than 3 drinks per occasion by women) impairs learning (cognitive) and psychomotor functions and increases the risk of alcohol-related problems, including accidents and injuries. CAGE QUESTIONS:   Have you ever felt that you should Cut down on your drinking?  Have people Annoyed you by criticizing your drinking?  Have you ever felt bad or Guilty about your drinking?  Have you ever had a drink first thing in the morning to steady your nerves or get rid of a hangover (Eye opener)? If you answered positively to any of these questions: You may be at risk for alcohol-related problems if alcohol consumption is:   Men: Greater than 14 drinks per week or more than 4 drinks per occasion.  Women: Greater than 7 drinks per week or more than 3 drinks per occasion. Do you or your family have a medical history of alcohol-related problems, such as:  Blackouts.  Sexual dysfunction.  Depression.  Trauma.  Liver dysfunction.  Sleep disorders.  Hypertension.  Chronic  abdominal pain.  Has your drinking ever caused you problems, such as problems with your family, problems with your work (or school) performance, or accidents/injuries?  Do you have a compulsion to drink or a preoccupation with drinking?  Do you have poor control or are you unable to stop drinking once you have started?  Do you have to drink to avoid withdrawal symptoms?  Do you have problems with withdrawal such as tremors, nausea, sweats, or mood disturbances?  Does it take more alcohol than in the past to get you high?  Do you feel a strong urge to drink?  Do you change your plans so that you can have a drink?  Do you ever drink in the morning to relieve the shakes or a hangover? If you have answered a number of the previous questions positively, it may be time for you to talk to your caregivers, family, and friends and see if they think you have a problem. Alcoholism is a chemical dependency that keeps getting worse and will eventually destroy your health and relationships. Many alcoholics end up dead, impoverished, or in prison. This is often the end result of all chemical dependency.  Do not be discouraged if you are not ready to take action immediately.  Decisions to change behavior often  involve up and down desires to change and feeling like you cannot decide.  Try to think more seriously about your drinking behavior.  Think of the reasons to quit. WHERE TO GO FOR ADDITIONAL INFORMATION   The National Institute on Alcohol Abuse and Alcoholism (NIAAA) BasicStudents.dk  [employer] on Alcoholism and Drug Dependence (NCADD) www.ncadd.org  American Society of Addiction Medicine (ASAM) RoyalDiary.gl  Document Released: 07/03/2005 Document Revised: 09/25/2011 Document Reviewed: 02/19/2008 [facility] Patient Information 2014 [place], [place]. Alcohol and Nutrition Nutrition serves two purposes. It provides energy. It also maintains body structure and function. Food supplies  energy. It also provides the building blocks needed to replace worn or damaged cells. Alcoholics often eat poorly. This limits their supply of essential nutrients. This affects energy supply and structure maintenance. Alcohol also affects the body's nutrients in:  Digestion.  Storage.  Using and getting rid of waste products. IMPAIRMENT OF NUTRIENT DIGESTION AND UTILIZATION   Once ingested, food must be broken down into small components (digested). Then it is available for energy. It helps maintain body structure and function. Digestion begins in the mouth. It continues in the stomach and intestines, with help from the pancreas. The nutrients from digested food are absorbed from the intestines into the blood. Then they are carried to the liver. The liver prepares nutrients for:  Immediate use.  Storage and future use.  Alcohol inhibits the breakdown of nutrients into usable molecules.  It decreases secretion of digestive enzymes from the pancreas.  Alcohol impairs nutrient absorption by damaging the cells lining the stomach and intestines.  It also interferes with moving some nutrients into the blood.  In addition, nutritional deficiencies themselves may lead to further absorption problems.  For example, folate deficiency changes the cells that line the small intestine. This impairs how water is absorbed. It also affects absorbed nutrients. These include glucose, sodium, and additional folate.  Even if nutrients are digested and absorbed, alcohol can prevent them from being fully used. It changes their transport, storage, and excretion. Impaired utilization of nutrients by alcoholics is indicated by:  Decreased liver stores of vitamins, such as vitamin A.  Increased excretion of nutrients such as fat. ALCOHOL AND ENERGY SUPPLY   Three basic nutritional components found in food are:  Carbohydrates.  Proteins.  Fats.  These are used as energy. Some alcoholics take in as much  as 50% of their total daily calories from alcohol. They often neglect important foods.  Even when enough food is eaten, alcohol can impair the ways the body controls blood sugar (glucose) levels. It may either increase or decrease blood sugar.  In non-diabetic alcoholics, increased blood sugar (hyperglycemia) is caused by poor insulin secretion. It is usually temporary.  Decreased blood sugar (hypoglycemia) can cause serious injury even if this condition is short-lived. Low blood sugar can happen when a fasting or malnourished person drinks alcohol. When there is no food to supply energy, stored sugar is used up. The products of alcohol inhibit forming glucose from other compounds such as amino acids. As a result, alcohol causes the brain and other body tissue to lack glucose. It is needed for energy and function.  Alcohol is an energy source. But how the body processes and uses the energy from alcohol is complex. Also, when alcohol is substituted for carbohydrates, subjects tend to lose weight. This indicates that they get less energy from alcohol than from food. ALCOHOL - MAINTAINING CELL STRUCTURE AND FUNCTION  Structure Cells are made mostly of protein. So  an adequate protein diet is important for maintaining cell structure. This is especially true if cells are being damaged. Research indicates that alcohol affects protein nutrition by causing impaired:  Digestion of proteins to amino acids.  Processing of amino acids by the small intestine and liver.  Synthesis of proteins from amino acids.  Protein secretion by the liver. Function Nutrients are essential for the body to function well. They provide the tools that the body needs to work well:   Proteins.  Vitamins.  Minerals. Alcohol can disrupt body function. It may cause nutrient deficiencies. And it may interfere with the way nutrients are processed. Vitamins  Vitamins are essential to maintain growth and normal metabolism. They  regulate many of the body`s processes. Chronic heavy drinking causes deficiencies in many vitamins. This is caused by eating less. And, in some cases, vitamins may be poorly absorbed. For example, alcohol inhibits fat absorption. It impairs how the vitamins A, E, and D are normally absorbed along with dietary fats. Not enough vitamin A may cause night blindness. Not enough vitamin D may cause softening of the bones.  Some alcoholics lack vitamins A, C, D, E, K, and the B vitamins. These are all involved in wound healing and cell maintenance. In particular, because vitamin K is necessary for blood clotting, lacking that vitamin can cause delayed clotting. The result is excess bleeding. Lacking other vitamins involved in brain function may cause severe neurological damage. Minerals Deficiencies of minerals such as calcium, magnesium, iron, and zinc are common in alcoholics. The alcohol itself does not seem to affect how these minerals are absorbed. Rather, they seem to occur secondary to other alcohol-related problems, such as:  Less calcium absorbed.  Not enough magnesium.  More urinary excretion.  Vomiting.  Diarrhea.  Not enough iron due to gastrointestinal bleeding.  Not enough zinc or losses related to other nutrient deficiencies.  Mineral deficiencies can cause a variety of medical consequences. These range from calcium-related bone disease to zinc-related night blindness and skin lesions. ALCOHOL, MALNUTRITION, AND MEDICAL COMPLICATIONS  Liver Disease   Alcoholic liver damage is caused primarily by alcohol itself. But poor nutrition may increase the risk of alcohol-related liver damage. For example, nutrients normally found in the liver are known to be affected by drinking alcohol. These include carotenoids, which are the major sources of vitamin A, and vitamin E compounds. Decreases in such nutrients may play some role in alcohol-related liver damage. Pancreatitis  Research suggests  that malnutrition may increase the risk of developing alcoholic pancreatitis. Research suggests that a diet lacking in protein may increase alcohol's damaging effect on the pancreas. Brain  Nutritional deficiencies may have severe effects on brain function. These may be permanent. Specifically, thiamine deficiencies are often seen in alcoholics. They can cause severe neurological problems. These include:  Impaired movement.  Memory loss seen in Wernicke-Korsakoff syndrome. Pregnancy  Alcohol has toxic effects on fetal development. It causes alcohol-related birth defects. They include fetal alcohol syndrome. Alcohol itself is toxic to the fetus. Also, the nutritional deficiency can affect how the fetus develops. That may compound the risk of developmental damage.  Nutritional needs during pregnancy are 10% to 30% greater than normal. Food intake can increase by as much as 140% to cover the needs of both mother and fetus. An alcoholic mother`s nutritional problems may adversely affect the nutrition of the fetus. And alcohol itself can also restrict nutrition flow to the fetus. NUTRITIONAL STATUS OF ALCOHOLICS  Techniques for assessing nutritional status include:  Taking body measurements to estimate fat reserves. They include:  Weight.  Height.  Mass.  Skin fold thickness.  Performing blood analysis to provide measurements of circulating:  Proteins.  Vitamins.  Minerals.  These techniques tend to be imprecise. For many nutrients, there is no clear "cut-off" point that would allow an accurate definition of deficiency. So assessing the nutritional status of alcoholics is limited by these techniques. Dietary status may provide information about the risk of developing nutritional problems. Dietary status is assessed by:  Taking patients' dietary histories.  Evaluating the amount and types of food they are eating.  It is difficult to determine what exact amount of alcohol begins to  have damaging effects on nutrition. In general, moderate drinkers have 2 drinks or less per day. They seem to be at little risk for nutritional problems. Various medical disorders begin to appear at greater levels.  Research indicates that the majority of even the heaviest drinkers have few obvious nutritional deficiencies. Many alcoholics who are hospitalized for medical complications of their disease do have severe malnutrition. Alcoholics tend to eat poorly. Often they eat less than the amounts of food necessary to provide enough:  Carbohydrates.  Protein.  Fat.  Vitamins A and C.  B vitamins.  Minerals like calcium and iron. Of major concern is alcohol's effect on digesting food and use of nutrients. It may shift a mildly malnourished person toward severe malnutrition. Document Released: 04/27/2005 Document Revised: 09/25/2011 Document Reviewed: 10/11/2005 [facility] Patient Information 2014 [place], [place]. B-Complex Vitamin with Vitamin C formulations What is this medicine? B-COMPLEX VITAMIN with VITAMIN C (B-KOM pleks VAHY tuh min with VAHY tuh min C) is a multivitamin. It is mostly used to help provide good nutrition to people who need extra intake of these vitamins due to stress, renal disease, or other medical conditions. This medicine may be used for other purposes; ask your health care provider or pharmacist if you have questions. What should I tell my health care provider before I take this medicine? They need to know if you have any of these conditions: -any chronic health condition -an unusual or allergic reaction to vitamins, other medicines, foods, dyes, or preservatives -pregnant or trying to get pregnant -breast-feeding How should I use this medicine? Take by mouth with a glass of water. May take with food. Follow the directions on the prescription label. This vitamin is usually given once a day. Do not take your medicine more often than directed. Talk to your  pediatrician regarding the use of this medicine in children. Special care may be needed. Overdosage: If you think you have taken too much of this medicine contact a poison control center or emergency room at once. NOTE: This medicine is only for you. Do not share this medicine with others. What if I miss a dose? If you miss a dose, take it as soon as you can. If it is almost time for your next dose, take only that dose. Do not take double or extra doses. What may interact with this medicine? -levodopa This list may not describe all possible interactions. Give your health care provider a list of all the medicines, herbs, non-prescription drugs, or dietary supplements you use. Also tell them if you smoke, drink alcohol, or use illegal drugs. Some items may interact with your medicine. What should I watch for while using this medicine? See your health care professional for regular checks on your progress. Remember that vitamin supplements do not replace the need for good nutrition  from a balanced diet. What side effects may I notice from receiving this medicine? Side effects that you should report to your doctor or health care professional as soon as possible: -allergic reaction such as skin rash or difficulty breathing -vomiting Side effects that usually do not require medical attention (report to your doctor or health care professional if they continue or are bothersome): -nausea -stomach upset This list may not describe all possible side effects. Call your doctor for medical advice about side effects. You may report side effects to FDA at 1-800-FDA-1088. Where should I keep my medicine? Keep out of the reach of children. Most vitamins should be stored at controlled room temperature. Check your specific product directions. Protect from heat and moisture. Throw away any unused medicine after the expiration date. NOTE: This sheet is a summary. It may not cover all possible information. If you have  questions about this medicine, talk to your doctor, pharmacist, or health care provider.  2012, Elsevier/Gold Standard. (10/03/2007 2:17:42 PM)

## 2013-03-04 NOTE — Progress Notes (Signed)
Subjective:    Patient ID: [name], male    DOB: [birth year], 57 y.o.   MRN: [id number]  HPI 57 year old male here because of fall last night 930pm.  Stubbed 5th left toe on door frame causing patient to fall.  Patient fell onto a bench, injuring left ribs.  Patient reluctant to breathe deeply due to pain.  Rib pain level: 9/10.  No nausea or vomiting. Left 5th toe bruised and painful.    Did drink vodka last nite and fell at home. See last 3 trauma visits, injuries related to etoh most likely. Dr. Cleta Alberts offered help/rehab and I did the same today.    Review of Systems     Objective:   Physical Exam  Constitutional: He appears well-developed and well-nourished. He appears distressed.  HENT:  Head: Normocephalic.  Eyes: EOM are normal. No scleral icterus.  Cardiovascular: Regular rhythm and normal pulses.  Tachycardia present.   Pulmonary/Chest: Not tachypneic. No respiratory distress. He has decreased breath sounds. He has no wheezes. He has rhonchi. He has rales.    Abdominal: Soft. Normal appearance. Bowel sounds are decreased. There is no hepatosplenomegaly. There is tenderness. There is no rebound and no CVA tenderness.     Results for orders placed in visit on 03/04/13  POCT CBC      Result Value Range   WBC 6.1  4.6 - 10.2 K/uL   Lymph, poc 1.6  0.6 - 3.4   POC LYMPH PERCENT 27.0  10 - 50 %L   MID (cbc) 0.5  0 - 0.9   POC MID % 8.0  0 - 12 %M   POC Granulocyte 4.0  2 - 6.9   Granulocyte percent 65.0  37 - 80 %G   RBC 4.60 (*) 4.69 - 6.13 M/uL   Hemoglobin 15.7  14.1 - 18.1 g/dL   HCT, POC 78.2  95.6 - 53.7 %   MCV 105.8 (*) 80 - 97 fL   MCH, POC 34.1 (*) 27 - 31.2 pg   MCHC 32.2  31.8 - 35.4 g/dL   RDW, POC 21.3     Platelet Count, POC 191  142 - 424 K/uL   MPV 8.1  0 - 99.8 fL  POCT URINALYSIS DIPSTICK      Result Value Range   Color, UA amber     Clarity, UA sl cloudy     Glucose, UA neg     Bilirubin, UA neg     Ketones, UA neg     Spec Grav, UA  1.020     Blood, UA moderate     pH, UA 7.0     Protein, UA neg     Urobilinogen, UA 0.2     Nitrite, UA neg     Leukocytes, UA Negative    POCT UA - MICROSCOPIC ONLY      Result Value Range   WBC, Ur, HPF, POC 0-2     RBC, urine, microscopic 10-12     Bacteria, U Microscopic trace     Mucus, UA neg     Epithelial cells, urine per micros 0-1     Crystals, Ur, HPF, POC neg     Casts, Ur, LPF, POC neg     Yeast, UA neg     [facility] reading (PRIMARY) by  Dr guest fx rib 10 maybe others, toe no new fx seen.       Assessment & Plan:  Rehab offered/He will think about it/ETOH abuse  likely Start B vitamins daily/Counsel on Stopping etoh/WD plan Hematuria microscopic needs f/up/Will tx for infection/R/O trauma Cipro/Vicodin/No alcohol use

## 2013-03-05 ENCOUNTER — Telehealth: Payer: Self-pay

## 2013-03-05 NOTE — Telephone Encounter (Signed)
PT STATES THE HYDROCODONE HE WAS GIVEN IS CAUSING HIM TO HAVE SPASMS. PLEASE CALL [phone] AND ADVISE

## 2013-03-05 NOTE — Telephone Encounter (Signed)
The hydrocodone should not cause spasms. His injury could cause spasms in the muscles at the site.  Is that where his spasms are occuring?

## 2013-03-05 NOTE — Telephone Encounter (Signed)
Is this a common side effest? Should we change his medication?

## 2013-03-06 ENCOUNTER — Telehealth: Payer: Self-pay

## 2013-03-06 ENCOUNTER — Ambulatory Visit (INDEPENDENT_AMBULATORY_CARE_PROVIDER_SITE_OTHER): Payer: BC Managed Care – PPO | Admitting: Internal Medicine

## 2013-03-06 VITALS — BP 136/66 | HR 105 | Temp 98.3°F | Resp 18 | Ht 63.0 in | Wt 160.0 lb

## 2013-03-06 DIAGNOSIS — F1010 Alcohol abuse, uncomplicated: Secondary | ICD-10-CM

## 2013-03-06 DIAGNOSIS — S2249XK Multiple fractures of ribs, unspecified side, subsequent encounter for fracture with nonunion: Secondary | ICD-10-CM

## 2013-03-06 DIAGNOSIS — R0789 Other chest pain: Secondary | ICD-10-CM

## 2013-03-06 DIAGNOSIS — R5381 Other malaise: Secondary | ICD-10-CM

## 2013-03-06 DIAGNOSIS — K739 Chronic hepatitis, unspecified: Secondary | ICD-10-CM

## 2013-03-06 DIAGNOSIS — R071 Chest pain on breathing: Secondary | ICD-10-CM

## 2013-03-06 DIAGNOSIS — IMO0002 Reserved for concepts with insufficient information to code with codable children: Secondary | ICD-10-CM

## 2013-03-06 DIAGNOSIS — R7989 Other specified abnormal findings of blood chemistry: Secondary | ICD-10-CM

## 2013-03-06 MED ORDER — CYANOCOBALAMIN 1000 MCG/ML IJ SOLN
1000.0000 ug | Freq: Once | INTRAMUSCULAR | Status: AC
  Administered 2013-03-06: 1000 ug via INTRAMUSCULAR

## 2013-03-06 MED ORDER — OXYCODONE-ACETAMINOPHEN 5-325 MG PO TABS: 1.0000 | ORAL_TABLET | Freq: Three times a day (TID) | ORAL | Status: DC | PRN

## 2013-03-06 NOTE — Progress Notes (Signed)
  Subjective:    Patient ID: [name], male    DOB: Dec 05, 1955, 57 y.o.   MRN: [id number]  HPI 57 y.o. Man presents to clinic today for follow up from his fractured ribs.   Last alcoholic drink 2 days ago. Patient states that he has controled alcoholism.  Had cup of soup, peanut butter and jelly and a glass of milk yesterday.   Currently prescribed alprazolam 1 mg. Recently started a B-complex vitamin.  States that hydrocodone is not working for the pain; states that he is supposed to take two a day. Took two last night. Xanax is helping with sleep. Owns his own business. Has been here many times for injuries, alcohol abuse is likely cause.Has been thru detox once. Liver tests abnormal strongly suggestive of alcohol abuse.     Review of Systems     Objective:   Physical Exam  Constitutional: He appears well-developed and well-nourished.  HENT:  Head: Normocephalic.  Eyes: EOM are normal. No scleral icterus.  Neck: Normal range of motion. Neck supple.  Cardiovascular: Normal rate, regular rhythm and normal heart sounds.   Pulmonary/Chest: Not tachypneic. No respiratory distress. He has decreased breath sounds. He has rhonchi.   He exhibits bony tenderness. He exhibits no crepitus, no deformity, no swelling and no retraction.    Rib pain and pleuritic pain   Clinically no worse. VS ok       Assessment & Plan:  Fx rib/Chest wall pain/No sob ETOH excess/Elevated LFTs Quit alcohol/Use alprazolam to wean off Vit Bcomplex Oxycodone for pain RTC 1 week

## 2013-03-06 NOTE — Patient Instructions (Addendum)
Alcoholic Liver Disease  Alcoholic liver disease means you have a damaged liver that does not work properly. This disease is caused by drinking too much alcohol. Some people do not have any problems until the disease has become very bad. Problems can be worse after a period of heavy drinking.  HOME CARE  · Stop drinking alcohol.  · Get expert advice or help (counseling) to quit drinking. Join an alchohol support group.  · You may need to eat foods that give you energy (carbohydrates), such as:  · Milk and yogurt.  · Navy, pinto, and white beans.  · Applesauce, grapes, dried dates, prunes, and raisins.  · Potatoes and rice.  · Eat foods that are high in vitamins, especially thiamine and folic acid. These foods include:  · Whole-wheat or whole-grain breads and cereals. Look on the package for "added thiamine" or "added folic acid."  · Meat, especially pork.  · Fresh, raw vegetables.  · Fresh fruits and vegetables, such as oranges, orange juice, avocados, beets, and cantaloupe.  · Dark green, leafy vegetables, such as romaine lettuce, spinach, and broccoli.  · Beans and nuts.  · Dairy foods, such as milk, butter, yogurt, cheese, and ice cream.  GET HELP RIGHT AWAY IF:   · You have bright red blood in your poop (stool).  · You cough or throw up (vomit) blood.  · Your skin and eyes turn more yellow.  · You have a bad headache or problems thinking.  · You have trouble walking.  MAKE SURE YOU:   · Understand these instructions.  · Will watch your condition.  · Will get help right away if you are not doing well or get worse.  Document Released: 04/30/2009 Document Revised: 09/25/2011 Document Reviewed: 04/30/2009  [employer]® Patient Information ©2014 [employer].

## 2013-03-06 NOTE — Telephone Encounter (Signed)
Pt states that he was just seen for multiple broken ribs and would like to know when he should return to work. Best#[phone]

## 2013-03-06 NOTE — Telephone Encounter (Signed)
LMOM to CB. 

## 2013-03-06 NOTE — Telephone Encounter (Signed)
Spoke with pt, gave message. Pt understood. Advised to RTC if not better soon.

## 2013-03-06 NOTE — Progress Notes (Signed)
  Subjective:    Patient ID: [name], male    DOB: [birth year], 57 y.o.   MRN: [id number]  HPI Fxed rib, hx of many   Review of Systems     Objective:   Physical Exam        Assessment & Plan:

## 2013-03-07 ENCOUNTER — Encounter (HOSPITAL_COMMUNITY): Payer: Self-pay | Admitting: Emergency Medicine

## 2013-03-07 ENCOUNTER — Emergency Department (HOSPITAL_COMMUNITY)
Admission: EM | Admit: 2013-03-07 | Discharge: 2013-03-07 | Disposition: A | Discharge status: Home / Self Care | Payer: BC Managed Care – PPO | Attending: Emergency Medicine | Admitting: Emergency Medicine

## 2013-03-07 DIAGNOSIS — W1809XA Striking against other object with subsequent fall, initial encounter: Secondary | ICD-10-CM | POA: Insufficient documentation

## 2013-03-07 DIAGNOSIS — S2239XA Fracture of one rib, unspecified side, initial encounter for closed fracture: Secondary | ICD-10-CM | POA: Insufficient documentation

## 2013-03-07 DIAGNOSIS — Z79899 Other long term (current) drug therapy: Secondary | ICD-10-CM | POA: Insufficient documentation

## 2013-03-07 DIAGNOSIS — M129 Arthropathy, unspecified: Secondary | ICD-10-CM | POA: Insufficient documentation

## 2013-03-07 DIAGNOSIS — F411 Generalized anxiety disorder: Secondary | ICD-10-CM | POA: Insufficient documentation

## 2013-03-07 DIAGNOSIS — F17200 Nicotine dependence, unspecified, uncomplicated: Secondary | ICD-10-CM | POA: Insufficient documentation

## 2013-03-07 DIAGNOSIS — I10 Essential (primary) hypertension: Secondary | ICD-10-CM | POA: Insufficient documentation

## 2013-03-07 DIAGNOSIS — Y939 Activity, unspecified: Secondary | ICD-10-CM | POA: Insufficient documentation

## 2013-03-07 DIAGNOSIS — Y929 Unspecified place or not applicable: Secondary | ICD-10-CM | POA: Insufficient documentation

## 2013-03-07 DIAGNOSIS — S2232XA Fracture of one rib, left side, initial encounter for closed fracture: Secondary | ICD-10-CM

## 2013-03-07 HISTORY — DX: Essential (primary) hypertension: I10

## 2013-03-07 MED ORDER — MORPHINE SULFATE 4 MG/ML IJ SOLN
6.0000 mg | Freq: Once | INTRAMUSCULAR | Status: AC
  Administered 2013-03-07: 6 mg via INTRAMUSCULAR
  Filled 2013-03-07: qty 2

## 2013-03-07 MED ORDER — OXYCODONE-ACETAMINOPHEN 10-325 MG PO TABS: 1.0000 | ORAL_TABLET | Freq: Four times a day (QID) | ORAL | Status: DC | PRN

## 2013-03-07 MED ORDER — IBUPROFEN 800 MG PO TABS: 800.0000 mg | ORAL_TABLET | Freq: Three times a day (TID) | ORAL | Status: DC | PRN

## 2013-03-07 MED ORDER — KETOROLAC TROMETHAMINE 60 MG/2ML IM SOLN
60.0000 mg | Freq: Once | INTRAMUSCULAR | Status: AC
  Administered 2013-03-07: 60 mg via INTRAMUSCULAR
  Filled 2013-03-07: qty 2

## 2013-03-07 NOTE — ED Provider Notes (Signed)
CSN: [id number]     Arrival date & time 03/07/13  1014 History     First MD Initiated Contact with Patient 03/07/13 1018     Chief Complaint  Patient presents with  . Rib Injury   (Consider location/radiation/quality/duration/timing/severity/associated sxs/prior Treatment) HPI  Presents to the emergency department with left rib pain.  Patient fell on Monday, striking his left ribs.  The patient, states he was seen at urgent care and noted to have rib fractures on x-ray.  Patient has continued pain and states, that his pain medicines are not helping.  Patient, states, that he does not have any shortness of breath, abdominal pain, nausea, vomiting, diarrhea, abdominal pain, neck pain, blurred vision, headache, or weakness.  Patient, states, that he has not any other medications.  Patient states, that palpation and movement make the pain, worse Past Medical History  Diagnosis Date  . Allergy   . Anxiety   . Arthritis   . Substance abuse   . Hypertension    Past Surgical History  Procedure Laterality Date  . Eye surgery     Family History  Problem Relation Age of Onset  . Heart disease Brother    History  Substance Use Topics  . Smoking status: Current Every Day Smoker  . Smokeless tobacco: Not on file  . Alcohol Use: 18.0 oz/week    30 Shots of liquor per week    Review of Systems All other systems negative except as documented in the HPI. All pertinent positives and negatives as reviewed in the HPI. Allergies  Codeine  Home Medications   Current Outpatient Rx  Name  Route  Sig  Dispense  Refill  . ALPRAZolam (XANAX) 1 MG tablet   Oral   Take 1 mg by mouth at bedtime as needed for sleep.         Marland Kitchen amLODipine (NORVASC) 10 MG tablet   Oral   Take 10 mg by mouth daily.         Marland Kitchen b complex vitamins tablet   Oral   Take 1 tablet by mouth daily.         . cetirizine (ZYRTEC) 10 MG tablet   Oral   Take 10 mg by mouth daily.         . ciprofloxacin (CIPRO)  500 MG tablet   Oral   Take 1 tablet (500 mg total) by mouth 2 (two) times daily.   20 tablet   0   . cloNIDine (CATAPRES) 0.1 MG tablet   Oral   Take 0.3 mg by mouth daily.          Marland Kitchen HYDROcodone-acetaminophen (NORCO/VICODIN) 5-325 MG per tablet   Oral   Take 1 tablet by mouth every 6 (six) hours as needed for pain.   30 tablet   0   . oxyCODONE-acetaminophen (ROXICET) 5-325 MG per tablet   Oral   Take 1 tablet by mouth every 8 (eight) hours as needed for pain.   30 tablet   0    BP 126/90  Pulse 115  Temp(Src) 97.8 F (36.6 C) (Oral)  Resp 15  SpO2 96% Physical Exam  Nursing note and vitals reviewed. Constitutional: He appears well-developed and well-nourished. No distress.  HENT:  Head: Normocephalic and atraumatic.  Mouth/Throat: Oropharynx is clear and moist.  Eyes: Pupils are equal, round, and reactive to light.  Cardiovascular: Normal rate, regular rhythm and normal heart sounds.   Pulmonary/Chest: Effort normal and breath sounds normal. No respiratory distress.  Chest wall is not dull to percussion. He exhibits tenderness and bony tenderness. He exhibits no mass, no crepitus, no deformity, no swelling and no retraction.      ED Course   Procedures (including critical care time)  Patient will be referred back to his primary care Dr. told to return here as needed.  Advised to use ice and heat on his ribs.  I reviewed the.  Rib films from a previous visit and there are rib fractures  MDM    Carlyle Dolly, PA-C 03/07/13 1216

## 2013-03-07 NOTE — ED Provider Notes (Signed)
Medical screening examination/treatment/procedure(s) were performed by non-physician practitioner and as supervising physician I was immediately available for consultation/collaboration.    Sharan Mcenaney R Kashina Mecum, MD 03/07/13 1406 

## 2013-03-07 NOTE — ED Notes (Signed)
Pt from home, c/o rib pain, was seen at [facility] for same on Mon, states spasms of ribs.

## 2013-09-25 ENCOUNTER — Other Ambulatory Visit: Payer: Self-pay | Admitting: Urology

## 2013-10-16 ENCOUNTER — Encounter (HOSPITAL_COMMUNITY): Payer: Self-pay | Admitting: Pharmacy Technician

## 2013-10-21 NOTE — Patient Instructions (Signed)
[name]  10/21/2013   Your procedure is scheduled on:  10/31/13 0730am-1030am  Report to [place] at      0500  AM.  Call this number if you have problems the morning of surgery: [phone]   Remember:   Do not eat food or drink liquids after midnight.   Take these medicines the morning of surgery with A SIP OF WATER:    Do not wear jewelry,   Do not wear lotions, powders, or perfumes.    Men may shave face and neck.  Do not bring valuables to the hospital.  Contacts, dentures or bridgework may not be worn into surgery.  Leave suitcase in the car. After surgery it may be brought to your room.  For patients admitted to the hospital, checkout time is 11:00 AM the day of  discharge.       Please read over the following fact sheets that you were given: [facility] - Preparing for Surgery Before surgery, you can play an important role.  Because skin is not sterile, your skin needs to be as free of germs as possible.  You can reduce the number of germs on your skin by washing with CHG (chlorahexidine gluconate) soap before surgery.  CHG is an antiseptic cleaner which kills germs and bonds with the skin to continue killing germs even after washing. Please DO NOT use if you have an allergy to CHG or antibacterial soaps.  If your skin becomes reddened/irritated stop using the CHG and inform your nurse when you arrive at Short Stay. Do not shave (including legs and underarms) for at least 48 hours prior to the first CHG shower.  You may shave your face. Please follow these instructions carefully:  1.  Shower with CHG Soap the night before surgery and the  morning of Surgery.  2.  If you choose to wash your hair, wash your hair first as usual with your  normal  shampoo.  3.  After you shampoo, rinse your hair and body thoroughly to remove the  shampoo.                           4.  Use CHG as you would any other liquid soap.  You can apply chg directly  to the skin and  wash                       Gently with a scrungie or clean washcloth.  5.  Apply the CHG Soap to your body ONLY FROM THE NECK DOWN.   Do not use on open                           Wound or open sores. Avoid contact with eyes, ears mouth and genitals (private parts).                        Genitals (private parts) with your normal soap.             6.  Wash thoroughly, paying special attention to the area where your surgery  will be performed.  7.  Thoroughly rinse your body with warm water from the neck down.  8.  DO NOT shower/wash with your normal soap after using and rinsing off  the CHG Soap.  9.  Pat yourself dry with a clean towel.            10.  Wear clean pajamas.            11.  Place clean sheets on your bed the night of your first shower and do not  sleep with pets. Day of Surgery : Do not apply any lotions/deodorants the morning of surgery.  Please wear clean clothes to the hospital/surgery center.  FAILURE TO FOLLOW THESE INSTRUCTIONS MAY RESULT IN THE CANCELLATION OF YOUR SURGERY PATIENT SIGNATURE_________________________________  NURSE SIGNATURE__________________________________  WHAT IS A BLOOD TRANSFUSION? Blood Transfusion Information  A transfusion is the replacement of blood or some of its parts. Blood is made up of multiple cells which provide different functions.  Red blood cells carry oxygen and are used for blood loss replacement.  White blood cells fight against infection.  Platelets control bleeding.  Plasma helps clot blood.  Other blood products are available for specialized needs, such as hemophilia or other clotting disorders. BEFORE THE TRANSFUSION  Who gives blood for transfusions?   Healthy volunteers who are fully evaluated to make sure their blood is safe. This is blood bank blood. Transfusion therapy is the safest it has ever been in the practice of medicine. Before blood is taken from a donor, a complete history is taken to make  sure that person has no history of diseases nor engages in risky social behavior (examples are intravenous drug use or sexual activity with multiple partners). The donor's travel history is screened to minimize risk of transmitting infections, such as malaria. The donated blood is tested for signs of infectious diseases, such as HIV and hepatitis. The blood is then tested to be sure it is compatible with you in order to minimize the chance of a transfusion reaction. If you or a relative donates blood, this is often done in anticipation of surgery and is not appropriate for emergency situations. It takes many days to process the donated blood. RISKS AND COMPLICATIONS Although transfusion therapy is very safe and saves many lives, the main dangers of transfusion include:   Getting an infectious disease.  Developing a transfusion reaction. This is an allergic reaction to something in the blood you were given. Every precaution is taken to prevent this. The decision to have a blood transfusion has been considered carefully by your caregiver before blood is given. Blood is not given unless the benefits outweigh the risks. AFTER THE TRANSFUSION  Right after receiving a blood transfusion, you will usually feel much better and more energetic. This is especially true if your red blood cells have gotten low (anemic). The transfusion raises the level of the red blood cells which carry oxygen, and this usually causes an energy increase.  The nurse administering the transfusion will monitor you carefully for complications. HOME CARE INSTRUCTIONS  No special instructions are needed after a transfusion. You may find your energy is better. Speak with your caregiver about any limitations on activity for underlying diseases you may have. SEEK MEDICAL CARE IF:   Your condition is not improving after your transfusion.  You develop redness or irritation at the intravenous (IV) site. SEEK IMMEDIATE MEDICAL CARE IF:   Any of the following symptoms occur over the next 12 hours:  Shaking chills.  You have a temperature by mouth above 102 F (38.9 C), not controlled by medicine.  Chest, back, or muscle pain.  People around you feel you are not acting correctly or are confused.  Shortness of breath or difficulty breathing.  Dizziness and fainting.  You get a rash or develop hives.  You have a decrease in urine output.  Your urine turns a dark color or changes to pink, red, or brown. Any of the following symptoms occur over the next 10 days:  You have a temperature by mouth above 102 F (38.9 C), not controlled by medicine.  Shortness of breath.  Weakness after normal activity.  The white part of the eye turns yellow (jaundice).  You have a decrease in the amount of urine or are urinating less often.  Your urine turns a dark color or changes to pink, red, or brown. Document Released: 06/30/2000 Document Revised: 09/25/2011 Document Reviewed: 02/17/2008 [facility] Patient Information 2014 [place].

## 2013-10-22 ENCOUNTER — Encounter (HOSPITAL_COMMUNITY)
Admission: RE | Admit: 2013-10-22 | Discharge: 2013-10-22 | Disposition: A | Discharge status: Home / Self Care | Payer: BC Managed Care – PPO | Source: Ambulatory Visit | Attending: Urology | Admitting: Urology

## 2013-10-22 ENCOUNTER — Encounter (HOSPITAL_COMMUNITY): Payer: Self-pay

## 2013-10-22 ENCOUNTER — Ambulatory Visit (HOSPITAL_COMMUNITY)
Admission: RE | Admit: 2013-10-22 | Discharge: 2013-10-22 | Disposition: A | Discharge status: Home / Self Care | Payer: BC Managed Care – PPO | Source: Ambulatory Visit | Attending: Anesthesiology | Admitting: Anesthesiology

## 2013-10-22 DIAGNOSIS — Z01818 Encounter for other preprocedural examination: Secondary | ICD-10-CM | POA: Insufficient documentation

## 2013-10-22 DIAGNOSIS — Z01812 Encounter for preprocedural laboratory examination: Secondary | ICD-10-CM | POA: Insufficient documentation

## 2013-10-22 DIAGNOSIS — Z01810 Encounter for preprocedural cardiovascular examination: Secondary | ICD-10-CM | POA: Insufficient documentation

## 2013-10-22 DIAGNOSIS — I10 Essential (primary) hypertension: Secondary | ICD-10-CM | POA: Insufficient documentation

## 2013-10-22 HISTORY — DX: Unspecified injury of head, initial encounter: S09.90XA

## 2013-10-22 HISTORY — DX: Inflammatory liver disease, unspecified: K75.9

## 2013-10-22 LAB — COMPREHENSIVE METABOLIC PANEL
ALK PHOS: 99 U/L (ref 39–117)
ALT: 13 U/L (ref 0–53)
AST: 20 U/L (ref 0–37)
Albumin: 3.8 g/dL (ref 3.5–5.2)
BUN: 10 mg/dL (ref 6–23)
CALCIUM: 9.3 mg/dL (ref 8.4–10.5)
CO2: 26 mEq/L (ref 19–32)
Chloride: 103 mEq/L (ref 96–112)
Creatinine, Ser: 0.94 mg/dL (ref 0.50–1.35)
Glucose, Bld: 66 mg/dL — ABNORMAL LOW (ref 70–99)
POTASSIUM: 4.3 meq/L (ref 3.7–5.3)
Sodium: 140 mEq/L (ref 137–147)
TOTAL PROTEIN: 7.1 g/dL (ref 6.0–8.3)
Total Bilirubin: 0.4 mg/dL (ref 0.3–1.2)

## 2013-10-22 LAB — CBC
HCT: 39.3 % (ref 39.0–52.0)
Hemoglobin: 13.2 g/dL (ref 13.0–17.0)
MCH: 30.9 pg (ref 26.0–34.0)
MCHC: 33.6 g/dL (ref 30.0–36.0)
MCV: 92 fL (ref 78.0–100.0)
PLATELETS: 279 10*3/uL (ref 150–400)
RBC: 4.27 MIL/uL (ref 4.22–5.81)
RDW: 13.4 % (ref 11.5–15.5)
WBC: 7.1 10*3/uL (ref 4.0–10.5)

## 2013-10-22 LAB — ABO/RH: ABO/RH(D): A POS

## 2013-10-22 IMAGING — CR DG CHEST 2V
2 series · 2 of 2 positions shown · non-contrast
Comparison: [DATE]

CLINICAL DATA: Preop for prostatectomy.  History of hypertension.

EXAM:
CHEST  2 VIEW

[w chest pa]
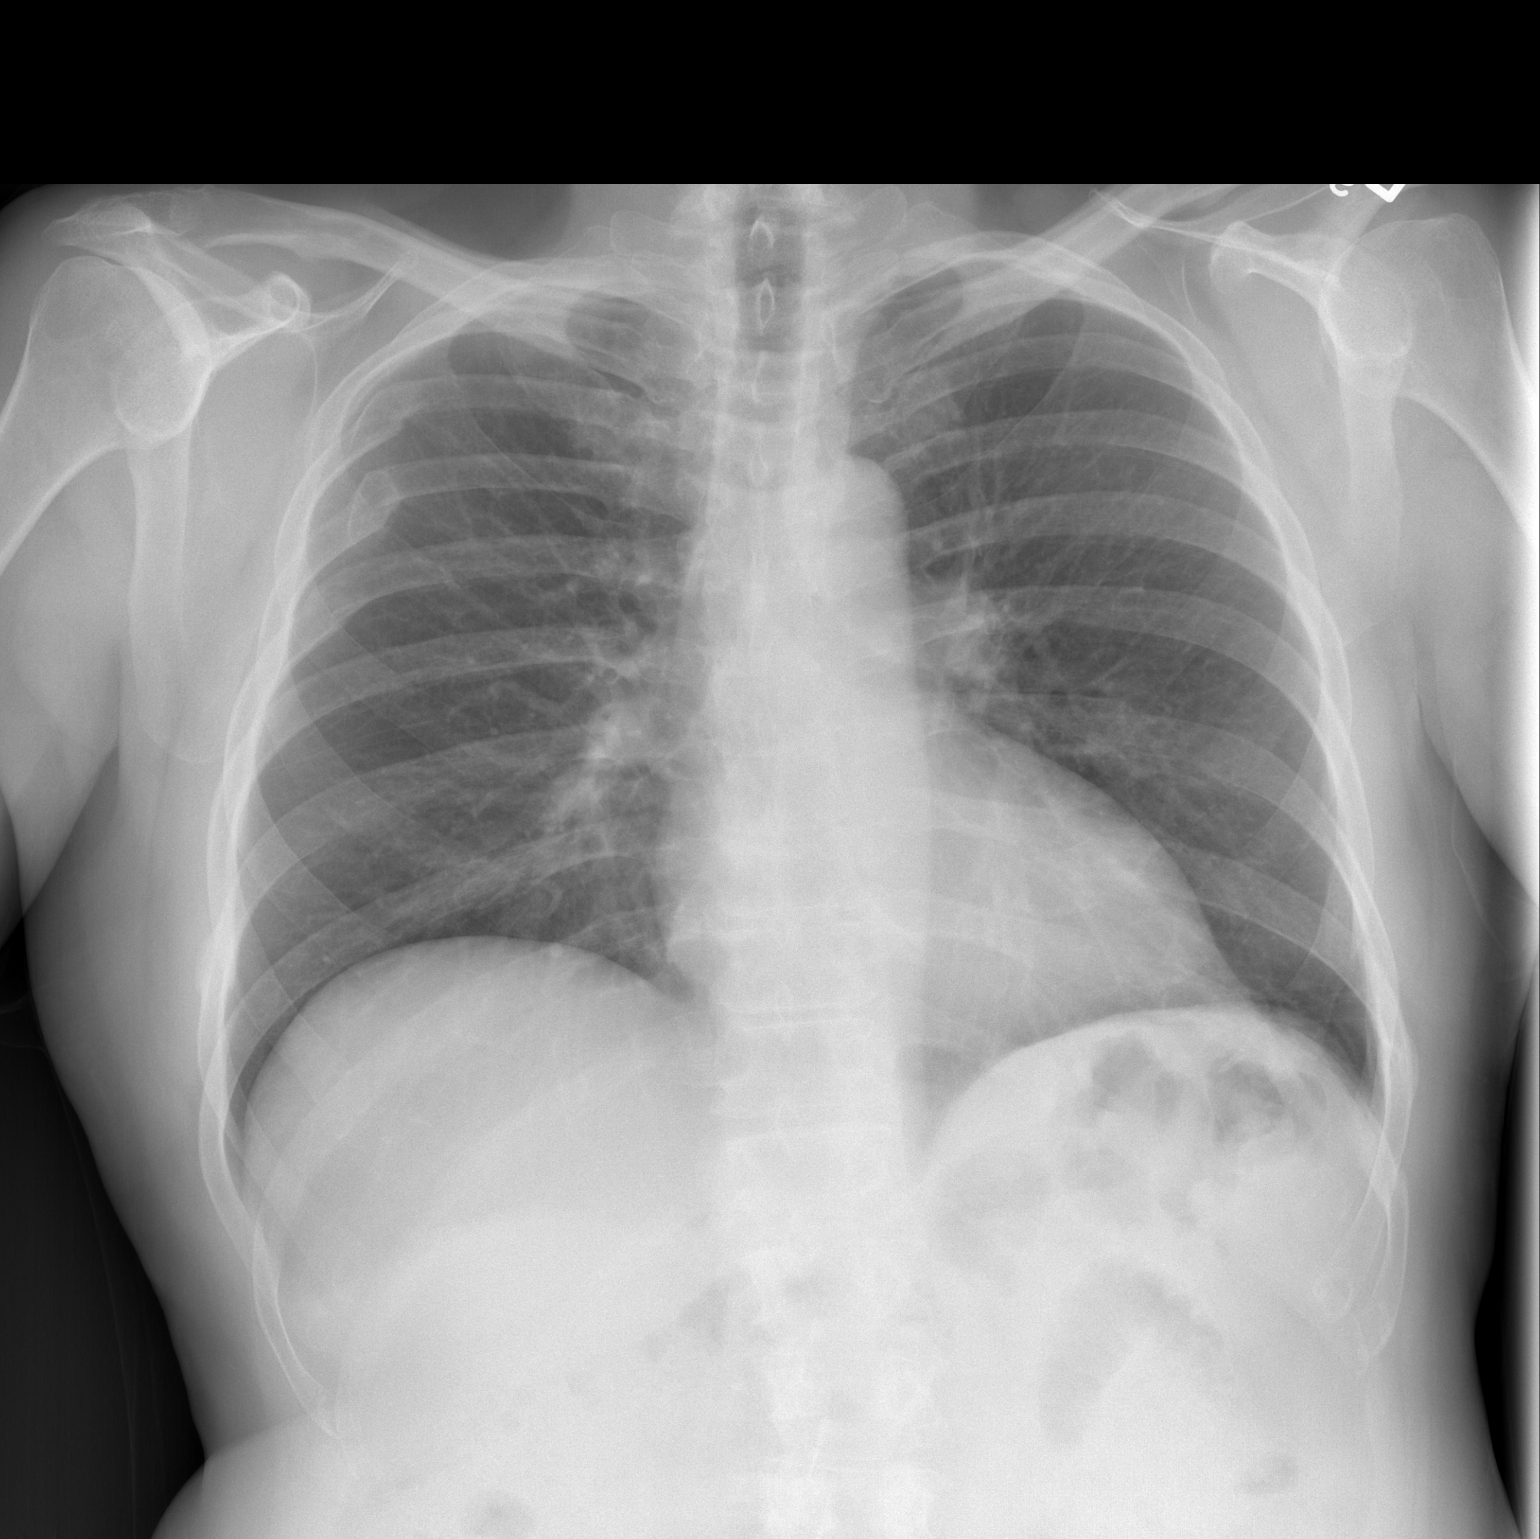

[w chest lat]
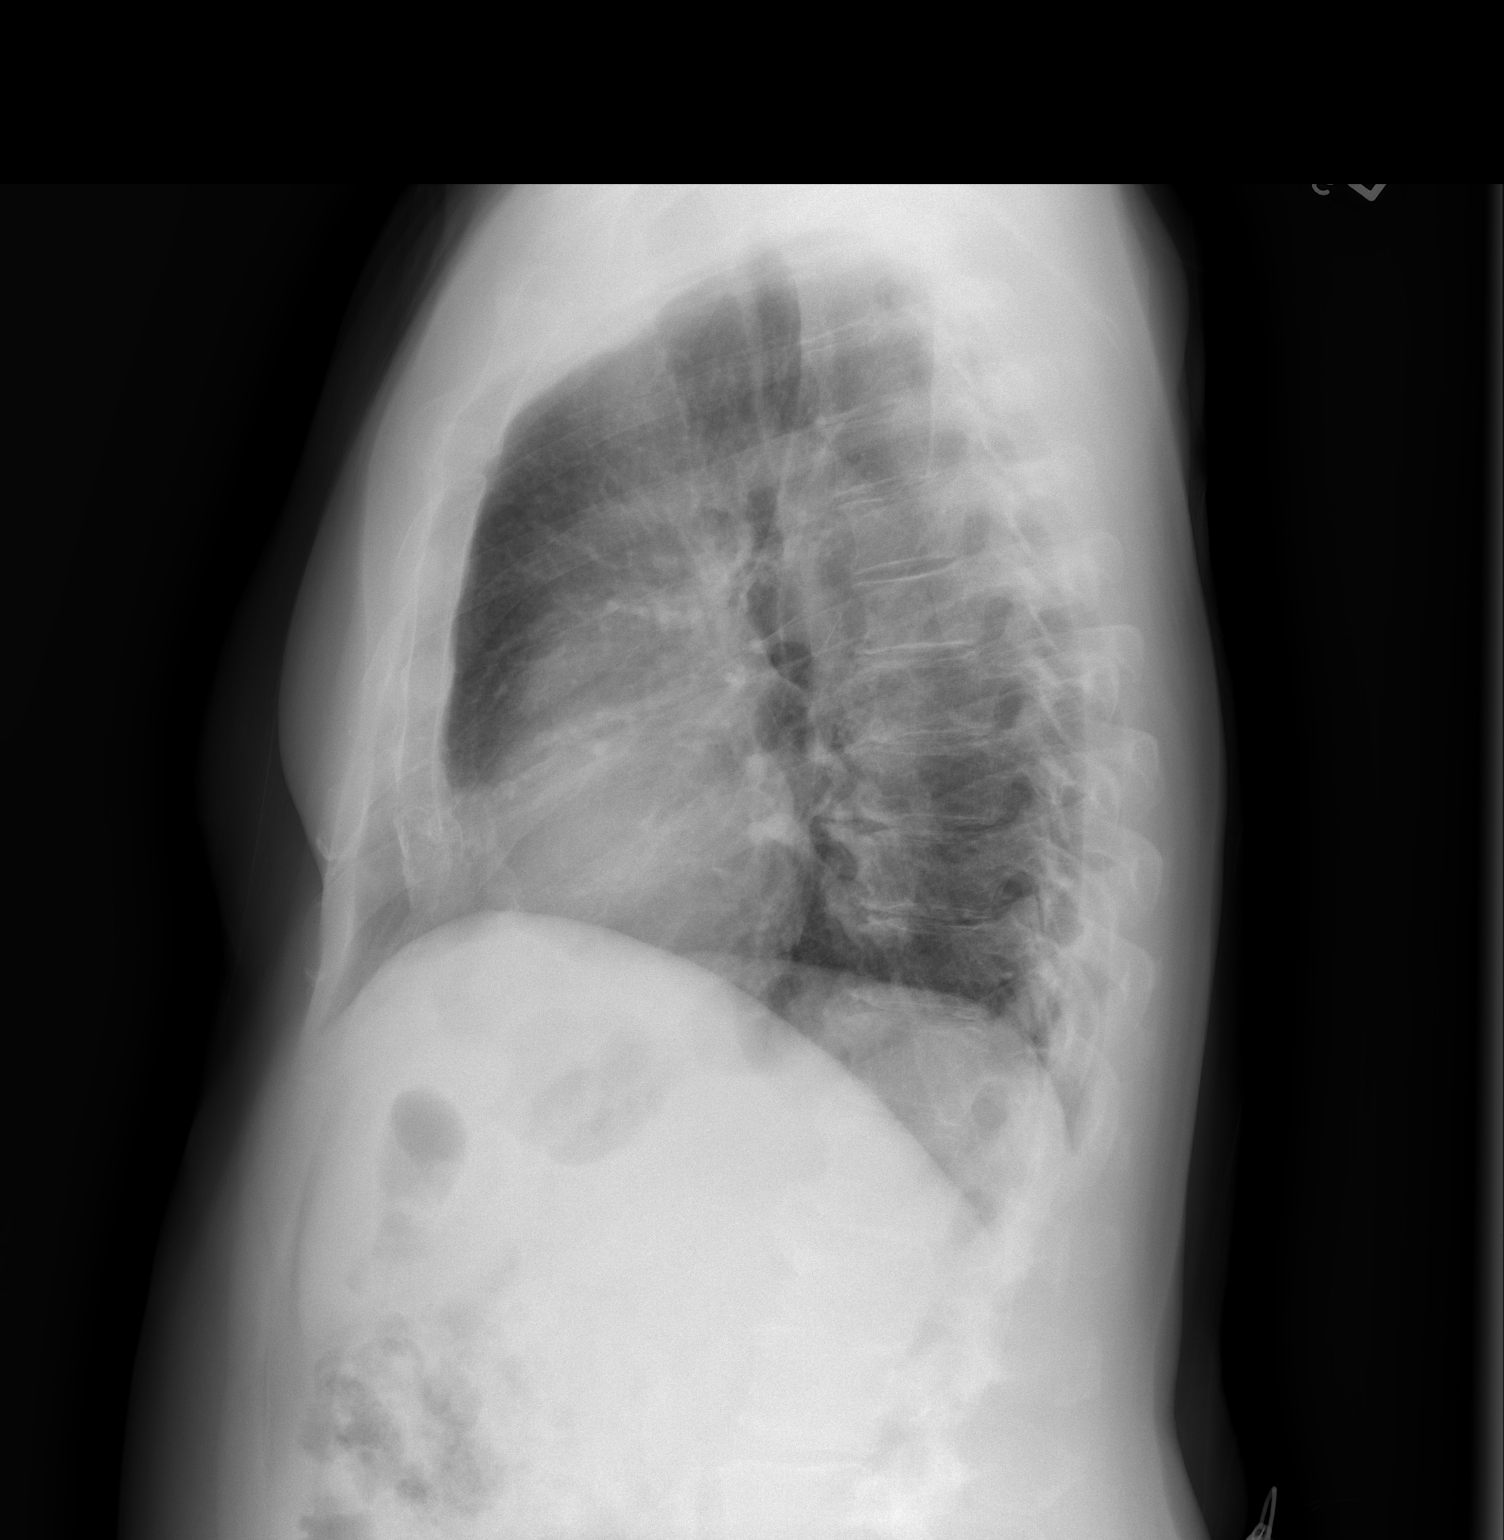

[2 of 2 positions shown; findings below may reference images not displayed]

FINDINGS: Normal heart, mediastinum and hila.

Clear lungs.  No pleural effusion.  No pneumothorax.

Bony thorax is demineralized. There are old healed bilateral rib
fractures.
IMPRESSION: No active cardiopulmonary disease.

## 2013-10-30 NOTE — Anesthesia Preprocedure Evaluation (Addendum)
Anesthesia Evaluation  Patient identified by MRN, date of birth, ID band Patient awake    Reviewed: Allergy & Precautions, H&P , NPO status , Patient's Chart, lab work & pertinent test results  Airway Mallampati: II TM Distance: >3 FB Neck ROM: Full    Dental  (+) Teeth Intact, Dental Advisory Given   Pulmonary neg pulmonary ROS, Current Smoker,  breath sounds clear to auscultation  Pulmonary exam normal       Cardiovascular hypertension, Pt. on medications negative cardio ROS  Rhythm:Regular Rate:Normal     Neuro/Psych Anxiety Prior history of closed head injury. negative neurological ROS  negative psych ROS   GI/Hepatic negative GI ROS, (+)     substance abuse (Patient states he has been sober for over a year.)  alcohol use, Hepatitis -, B  Endo/Other  negative endocrine ROS  Renal/GU negative Renal ROS  negative genitourinary   Musculoskeletal negative musculoskeletal ROS (+)   Abdominal   Peds negative pediatric ROS (+)  Hematology negative hematology ROS (+)   Anesthesia Other Findings   Reproductive/Obstetrics                         Anesthesia Physical Anesthesia Plan  ASA: II  Anesthesia Plan: General   Post-op Pain Management:    Induction: Intravenous  Airway Management Planned: Oral ETT  Additional Equipment:   Intra-op Plan:   Post-operative Plan: Extubation in OR  Informed Consent: I have reviewed the patients History and Physical, chart, labs and discussed the procedure including the risks, benefits and alternatives for the proposed anesthesia with the patient or authorized representative who has indicated his/her understanding and acceptance.   Dental advisory given  Plan Discussed with: CRNA  Anesthesia Plan Comments:         Anesthesia Quick Evaluation

## 2013-10-31 ENCOUNTER — Inpatient Hospital Stay (HOSPITAL_COMMUNITY): Payer: BC Managed Care – PPO | Admitting: Anesthesiology

## 2013-10-31 ENCOUNTER — Encounter (HOSPITAL_COMMUNITY): Payer: BC Managed Care – PPO | Admitting: Anesthesiology

## 2013-10-31 ENCOUNTER — Encounter (HOSPITAL_COMMUNITY)
Admission: RE | Disposition: A | Discharge status: Home / Self Care | Payer: Self-pay | Source: Ambulatory Visit | Attending: Urology

## 2013-10-31 ENCOUNTER — Inpatient Hospital Stay (HOSPITAL_COMMUNITY)
Admission: RE | Admit: 2013-10-31 | Discharge: 2013-11-01 | DRG: 708 | Disposition: A | Discharge status: Home / Self Care | Payer: BC Managed Care – PPO | Source: Ambulatory Visit | Attending: Urology | Admitting: Urology

## 2013-10-31 ENCOUNTER — Encounter (HOSPITAL_COMMUNITY): Payer: Self-pay | Admitting: *Deleted

## 2013-10-31 DIAGNOSIS — H532 Diplopia: Secondary | ICD-10-CM | POA: Diagnosis present

## 2013-10-31 DIAGNOSIS — C61 Malignant neoplasm of prostate: Principal | ICD-10-CM | POA: Diagnosis present

## 2013-10-31 DIAGNOSIS — F17200 Nicotine dependence, unspecified, uncomplicated: Secondary | ICD-10-CM | POA: Diagnosis present

## 2013-10-31 DIAGNOSIS — Z8249 Family history of ischemic heart disease and other diseases of the circulatory system: Secondary | ICD-10-CM

## 2013-10-31 DIAGNOSIS — T7589XS Other specified effects of external causes, sequela: Secondary | ICD-10-CM

## 2013-10-31 DIAGNOSIS — I10 Essential (primary) hypertension: Secondary | ICD-10-CM | POA: Diagnosis present

## 2013-10-31 DIAGNOSIS — Z79899 Other long term (current) drug therapy: Secondary | ICD-10-CM

## 2013-10-31 DIAGNOSIS — T788XXS Other adverse effects, not elsewhere classified, sequela: Secondary | ICD-10-CM

## 2013-10-31 HISTORY — PX: ROBOT ASSISTED LAPAROSCOPIC RADICAL PROSTATECTOMY: SHX5141

## 2013-10-31 HISTORY — PX: LYMPHADENECTOMY: SHX5960

## 2013-10-31 LAB — RAPID URINE DRUG SCREEN, HOSP PERFORMED
Amphetamines: NOT DETECTED
Amphetamines: NOT DETECTED
Barbiturates: NOT DETECTED
Barbiturates: NOT DETECTED
Benzodiazepines: POSITIVE — AB
Benzodiazepines: NOT DETECTED
Cocaine: NOT DETECTED
Cocaine: NOT DETECTED
Opiates: NOT DETECTED
Opiates: NOT DETECTED
Tetrahydrocannabinol: NOT DETECTED
Tetrahydrocannabinol: NOT DETECTED

## 2013-10-31 LAB — HEMOGLOBIN AND HEMATOCRIT, BLOOD
HEMATOCRIT: 38.8 % — AB (ref 39.0–52.0)
HEMOGLOBIN: 13 g/dL (ref 13.0–17.0)

## 2013-10-31 LAB — TYPE AND SCREEN
ABO/RH(D): A POS
Antibody Screen: NEGATIVE

## 2013-10-31 SURGERY — ROBOTIC ASSISTED LAPAROSCOPIC RADICAL PROSTATECTOMY
Anesthesia: General

## 2013-10-31 MED ORDER — LACTATED RINGERS IR SOLN
Status: DC | PRN
  Administered 2013-10-31: 1000 mL

## 2013-10-31 MED ORDER — ROCURONIUM BROMIDE 100 MG/10ML IV SOLN
INTRAVENOUS | Status: DC | PRN
  Administered 2013-10-31: 45 mg via INTRAVENOUS

## 2013-10-31 MED ORDER — SUFENTANIL CITRATE 50 MCG/ML IV SOLN
INTRAVENOUS | Status: AC
  Filled 2013-10-31: qty 1

## 2013-10-31 MED ORDER — SODIUM CHLORIDE 0.9 % IV BOLUS (SEPSIS)
1000.0000 mL | Freq: Once | INTRAVENOUS | Status: AC
  Administered 2013-10-31: 1000 mL via INTRAVENOUS

## 2013-10-31 MED ORDER — CEFAZOLIN SODIUM-DEXTROSE 2-3 GM-% IV SOLR
2.0000 g | INTRAVENOUS | Status: AC
  Administered 2013-10-31: 2 g via INTRAVENOUS

## 2013-10-31 MED ORDER — SUFENTANIL CITRATE 50 MCG/ML IV SOLN
INTRAVENOUS | Status: DC | PRN
  Administered 2013-10-31 (×5): 5 ug via INTRAVENOUS

## 2013-10-31 MED ORDER — OXYCODONE HCL 5 MG PO TABS
5.0000 mg | ORAL_TABLET | ORAL | Status: DC | PRN
  Administered 2013-10-31 – 2013-11-01 (×2): 5 mg via ORAL
  Filled 2013-10-31 (×2): qty 1

## 2013-10-31 MED ORDER — HYDROMORPHONE HCL PF 1 MG/ML IJ SOLN: 0.2500 mg | INTRAMUSCULAR | Status: DC | PRN

## 2013-10-31 MED ORDER — NEOSTIGMINE METHYLSULFATE 1 MG/ML IJ SOLN
INTRAMUSCULAR | Status: AC
  Filled 2013-10-31: qty 10

## 2013-10-31 MED ORDER — SODIUM CHLORIDE 0.9 % IJ SOLN
INTRAMUSCULAR | Status: AC
  Filled 2013-10-31: qty 20

## 2013-10-31 MED ORDER — ACETAMINOPHEN 325 MG PO TABS: 650.0000 mg | ORAL_TABLET | ORAL | Status: DC | PRN

## 2013-10-31 MED ORDER — METOCLOPRAMIDE HCL 5 MG/ML IJ SOLN
INTRAMUSCULAR | Status: AC
  Filled 2013-10-31: qty 2

## 2013-10-31 MED ORDER — DEXTROSE-NACL 5-0.45 % IV SOLN
INTRAVENOUS | Status: DC
  Administered 2013-10-31: 1000 mL via INTRAVENOUS
  Administered 2013-10-31 (×2): via INTRAVENOUS

## 2013-10-31 MED ORDER — METOCLOPRAMIDE HCL 5 MG/ML IJ SOLN
INTRAMUSCULAR | Status: DC | PRN
  Administered 2013-10-31: 10 mg via INTRAVENOUS

## 2013-10-31 MED ORDER — MORPHINE SULFATE 2 MG/ML IJ SOLN: 2.0000 mg | INTRAMUSCULAR | Status: DC | PRN

## 2013-10-31 MED ORDER — PROMETHAZINE HCL 25 MG/ML IJ SOLN: 6.2500 mg | INTRAMUSCULAR | Status: DC | PRN

## 2013-10-31 MED ORDER — LACTATED RINGERS IV SOLN
INTRAVENOUS | Status: DC | PRN
  Administered 2013-10-31 (×2): via INTRAVENOUS

## 2013-10-31 MED ORDER — ONDANSETRON HCL 4 MG/2ML IJ SOLN
INTRAMUSCULAR | Status: DC | PRN
  Administered 2013-10-31: 4 mg via INTRAVENOUS

## 2013-10-31 MED ORDER — KETOROLAC TROMETHAMINE 15 MG/ML IJ SOLN
30.0000 mg | Freq: Four times a day (QID) | INTRAMUSCULAR | Status: DC
  Administered 2013-10-31 – 2013-11-01 (×4): 30 mg via INTRAVENOUS
  Filled 2013-10-31: qty 1
  Filled 2013-10-31 (×2): qty 2
  Filled 2013-10-31: qty 1
  Filled 2013-10-31: qty 2
  Filled 2013-10-31 (×2): qty 1
  Filled 2013-10-31 (×4): qty 2

## 2013-10-31 MED ORDER — NEOSTIGMINE METHYLSULFATE 1 MG/ML IJ SOLN
INTRAMUSCULAR | Status: DC | PRN
  Administered 2013-10-31: 4 mg via INTRAVENOUS

## 2013-10-31 MED ORDER — HYDROXYZINE PAMOATE 25 MG PO CAPS
25.0000 mg | ORAL_CAPSULE | Freq: Two times a day (BID) | ORAL | Status: DC | PRN
  Filled 2013-10-31: qty 1

## 2013-10-31 MED ORDER — GLYCOPYRROLATE 0.2 MG/ML IJ SOLN
INTRAMUSCULAR | Status: AC
  Filled 2013-10-31: qty 3

## 2013-10-31 MED ORDER — MIDAZOLAM HCL 2 MG/2ML IJ SOLN
INTRAMUSCULAR | Status: AC
  Filled 2013-10-31: qty 2

## 2013-10-31 MED ORDER — BUPIVACAINE LIPOSOME 1.3 % IJ SUSP
20.0000 mL | Freq: Once | INTRAMUSCULAR | Status: AC
  Administered 2013-10-31: 20 mL
  Filled 2013-10-31: qty 20

## 2013-10-31 MED ORDER — ROCURONIUM BROMIDE 100 MG/10ML IV SOLN
INTRAVENOUS | Status: AC
  Filled 2013-10-31: qty 1

## 2013-10-31 MED ORDER — LIDOCAINE HCL (CARDIAC) 20 MG/ML IV SOLN
INTRAVENOUS | Status: DC | PRN
  Administered 2013-10-31: 50 mg via INTRAVENOUS

## 2013-10-31 MED ORDER — ONDANSETRON HCL 4 MG/2ML IJ SOLN
INTRAMUSCULAR | Status: AC
  Filled 2013-10-31: qty 2

## 2013-10-31 MED ORDER — OXYCODONE-ACETAMINOPHEN 5-325 MG PO TABS: 1.0000 | ORAL_TABLET | Freq: Four times a day (QID) | ORAL | Status: DC | PRN

## 2013-10-31 MED ORDER — LIDOCAINE HCL (CARDIAC) 20 MG/ML IV SOLN
INTRAVENOUS | Status: AC
  Filled 2013-10-31: qty 5

## 2013-10-31 MED ORDER — ALPRAZOLAM 0.5 MG PO TABS: 0.5000 mg | ORAL_TABLET | Freq: Every evening | ORAL | Status: DC | PRN

## 2013-10-31 MED ORDER — GLYCOPYRROLATE 0.2 MG/ML IJ SOLN
INTRAMUSCULAR | Status: DC | PRN
  Administered 2013-10-31: 0.2 mg via INTRAVENOUS
  Administered 2013-10-31: .6 mg via INTRAVENOUS

## 2013-10-31 MED ORDER — CISATRACURIUM BESYLATE (PF) 10 MG/5ML IV SOLN
INTRAVENOUS | Status: DC | PRN
  Administered 2013-10-31: 5 mg via INTRAVENOUS
  Administered 2013-10-31: 2 mg via INTRAVENOUS

## 2013-10-31 MED ORDER — PROPOFOL 10 MG/ML IV BOLUS
INTRAVENOUS | Status: AC
  Filled 2013-10-31: qty 20

## 2013-10-31 MED ORDER — LACTATED RINGERS IV SOLN: INTRAVENOUS | Status: DC

## 2013-10-31 MED ORDER — CIPROFLOXACIN HCL 500 MG PO TABS: 500.0000 mg | ORAL_TABLET | Freq: Two times a day (BID) | ORAL | Status: DC

## 2013-10-31 MED ORDER — PNEUMOCOCCAL VAC POLYVALENT 25 MCG/0.5ML IJ INJ
0.5000 mL | INJECTION | INTRAMUSCULAR | Status: AC
  Administered 2013-11-01: 0.5 mL via INTRAMUSCULAR
  Filled 2013-10-31 (×2): qty 0.5

## 2013-10-31 MED ORDER — CLONIDINE HCL 0.1 MG PO TABS
0.1000 mg | ORAL_TABLET | Freq: Every morning | ORAL | Status: DC
  Administered 2013-11-01: 0.1 mg via ORAL
  Filled 2013-10-31: qty 1

## 2013-10-31 MED ORDER — AMLODIPINE BESYLATE 10 MG PO TABS
10.0000 mg | ORAL_TABLET | Freq: Every morning | ORAL | Status: DC
  Administered 2013-11-01: 10 mg via ORAL
  Filled 2013-10-31: qty 1

## 2013-10-31 MED ORDER — STERILE WATER FOR IRRIGATION IR SOLN
Status: DC | PRN
  Administered 2013-10-31: 3000 mL

## 2013-10-31 MED ORDER — CEFAZOLIN SODIUM-DEXTROSE 2-3 GM-% IV SOLR
INTRAVENOUS | Status: AC
  Filled 2013-10-31: qty 50

## 2013-10-31 MED ORDER — PROPOFOL 10 MG/ML IV BOLUS
INTRAVENOUS | Status: DC | PRN
  Administered 2013-10-31: 170 mg via INTRAVENOUS

## 2013-10-31 SURGICAL SUPPLY — 52 items
ADH SKN CLS APL DERMABOND .7 (GAUZE/BANDAGES/DRESSINGS) ×2
CABLE HIGH FREQUENCY MONO STRZ (ELECTRODE) ×3 IMPLANT
CANISTER SUCTION 2500CC (MISCELLANEOUS) ×1 IMPLANT
CATH FOLEY 2WAY SLVR 18FR 30CC (CATHETERS) ×3 IMPLANT
CATH TIEMANN FOLEY 18FR 5CC (CATHETERS) ×3 IMPLANT
CHLORAPREP W/TINT 26ML (MISCELLANEOUS) ×3 IMPLANT
CLIP LIGATING HEM O LOK PURPLE (MISCELLANEOUS) ×8 IMPLANT
CLIP LIGATING HEMO LOK XL GOLD (MISCELLANEOUS) ×1 IMPLANT
CLOTH BEACON ORANGE TIMEOUT ST (SAFETY) ×3 IMPLANT
CONT SPEC 4OZ CLIKSEAL STRL BL (MISCELLANEOUS) ×3 IMPLANT
CONT SPECI 4OZ STER CLIK (MISCELLANEOUS) ×3 IMPLANT
COVER SURGICAL LIGHT HANDLE (MISCELLANEOUS) ×3 IMPLANT
COVER TIP SHEARS 8 DVNC (MISCELLANEOUS) ×2 IMPLANT
COVER TIP SHEARS 8MM DA VINCI (MISCELLANEOUS) ×1
CUTTER ECHEON FLEX ENDO 45 340 (ENDOMECHANICALS) ×3 IMPLANT
DECANTER SPIKE VIAL GLASS SM (MISCELLANEOUS) ×2 IMPLANT
DERMABOND ADVANCED (GAUZE/BANDAGES/DRESSINGS) ×1
DERMABOND ADVANCED .7 DNX12 (GAUZE/BANDAGES/DRESSINGS) ×2 IMPLANT
DRAPE SURG IRRIG POUCH 19X23 (DRAPES) ×3 IMPLANT
DRSG TEGADERM 4X4.75 (GAUZE/BANDAGES/DRESSINGS) ×6 IMPLANT
DRSG TEGADERM 6X8 (GAUZE/BANDAGES/DRESSINGS) ×6 IMPLANT
ELECT REM PT RETURN 9FT ADLT (ELECTROSURGICAL) ×3
ELECTRODE REM PT RTRN 9FT ADLT (ELECTROSURGICAL) ×2 IMPLANT
GAUZE SPONGE 2X2 8PLY STRL LF (GAUZE/BANDAGES/DRESSINGS) ×2 IMPLANT
GLOVE BIO SURGEON STRL SZ 6.5 (GLOVE) ×3 IMPLANT
GLOVE BIOGEL M STRL SZ7.5 (GLOVE) ×9 IMPLANT
GOWN STRL REUS W/TWL LRG LVL4 (GOWN DISPOSABLE) ×9 IMPLANT
GOWN STRL REUS W/TWL XL LVL3 (GOWN DISPOSABLE) ×1 IMPLANT
HOLDER FOLEY CATH W/STRAP (MISCELLANEOUS) ×3 IMPLANT
IV LACTATED RINGERS 1000ML (IV SOLUTION) ×1 IMPLANT
KIT ACCESSORY DA VINCI DISP (KITS) ×1
KIT ACCESSORY DVNC DISP (KITS) ×2 IMPLANT
KIT PROCEDURE DA VINCI SI (MISCELLANEOUS) ×1
KIT PROCEDURE DVNC SI (MISCELLANEOUS) ×2 IMPLANT
NDL INSUFFLATION 14GA 120MM (NEEDLE) ×2 IMPLANT
NEEDLE INSUFFLATION 14GA 120MM (NEEDLE) ×3 IMPLANT
NEEDLE SPNL 22GX7 SPINOC (NEEDLE) ×3 IMPLANT
PACK ROBOT UROLOGY CUSTOM (CUSTOM PROCEDURE TRAY) ×3 IMPLANT
RELOAD GREEN ECHELON 45 (STAPLE) ×3 IMPLANT
SET TUBE IRRIG SUCTION NO TIP (IRRIGATION / IRRIGATOR) ×3 IMPLANT
SOLUTION ELECTROLUBE (MISCELLANEOUS) ×3 IMPLANT
SPONGE GAUZE 2X2 STER 10/PKG (GAUZE/BANDAGES/DRESSINGS)
SPONGE LAP 4X18 X RAY DECT (DISPOSABLE) ×3 IMPLANT
SUT ETHILON 3 0 PS 1 (SUTURE) ×3 IMPLANT
SUT MNCRL AB 4-0 PS2 18 (SUTURE) ×6 IMPLANT
SUT PDS AB 1 CT1 27 (SUTURE) ×6 IMPLANT
SUT VLOC BARB 180 ABS3/0GR12 (SUTURE) ×9
SUTURE VLOC BRB 180 ABS3/0GR12 (SUTURE) IMPLANT
SYR 27GX1/2 1ML LL SAFETY (SYRINGE) ×3 IMPLANT
TOWEL OR NON WOVEN STRL DISP B (DISPOSABLE) ×3 IMPLANT
TROCAR 12M 150ML BLUNT (TROCAR) ×3 IMPLANT
WATER STERILE IRR 1500ML POUR (IV SOLUTION) ×4 IMPLANT

## 2013-10-31 NOTE — Care Management Note (Signed)
    Page 1 of 1   10/31/2013     12:24:06 PM CARE MANAGEMENT NOTE 10/31/2013  Patient:  [name], [name]   Account Number:  [id number]  Date Initiated:  10/31/2013  Documentation initiated by:  Dessa Phi  Subjective/Objective Assessment:   55 [address] [place].     Action/Plan:   FROM  HOME.HAS PCP,PHARMACY.   Anticipated DC Date:  11/01/2013   Anticipated DC Plan:  [place]  CM consult      Choice offered to / List presented to:             Status of service:  In process, will continue to follow Medicare Important Message given?   (If response is "NO", the following Medicare IM given date fields will be blank) Date Medicare IM given:   Date Additional Medicare IM given:    Discharge Disposition:    Per UR Regulation:  Reviewed for med. necessity/level of care/duration of stay  If discussed at [place] of Stay Meetings, dates discussed:    Comments:  10/31/13 Phuong Moffatt RN,BSN NCM [phone] S/P LAP RAD PROSTATECTOMY.NO ANTICIPATED D/C NEEDS.
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
## 2013-10-31 NOTE — Discharge Instructions (Signed)
1. Activity:  You are encouraged to ambulate frequently (about every hour during waking hours) to help prevent blood clots from forming in your legs or lungs.  However, you should not engage in any heavy lifting (> 10-15 lbs), strenuous activity, or straining. °2. Diet: You should continue a clear liquid diet until passing gas from below.  Once this occurs, you may advance your diet to a soft diet that would be easy to digest (i.e soups, scrambled eggs, mashed potatoes, etc.) for 24 hours just as you would if getting over a bad stomach flu.  If tolerating this diet well for 24 hours, you may then begin eating regular food.  It will be normal to have some amount of bloating, nausea, and abdominal discomfort intermittently. °3. Prescriptions:  You will be provided a prescription for pain medication to take as needed.  If your pain is not severe enough to require the prescription pain medication, you may take extra strength Tylenol instead.  You should also take an over the counter stool softener (Colace 100 mg twice daily) to avoid straining with bowel movements as the pain medication may constipate you. Finally, you will also be provided a prescription for an antibiotic to begin the day prior to your return visit in the office for catheter removal. °4. Catheter care: You will be taught how to take care of the catheter by the nursing staff prior to discharge from the hospital.  You may use both a leg bag and the larger bedside bag but it is recommended to at least use the bigger bedside bag at nighttime as the leg bag is small and will fill up overnight and also does not drain as well when lying flat. You may periodically feel a strong urge to void with the catheter in place.  This is a bladder spasm and most often can occur when having a bowel movement or when you are moving around. It is typically self-limited and usually will stop after a few minutes.  You may use some Vaseline or Neosporin around the tip of the  catheter to reduce friction at the tip of the penis. °5. Incisions: You may remove your dressing bandages the 2nd day after surgery.  You most likely will have a few small staples in each of the incisions and once the bandages are removed, the incisions may stay open to air.  You may start showering (not soaking or bathing in water) 48 hours after surgery and the incisions simply need to be patted dry after the shower.  No additional care is needed. °6. What to call us about: You should call the office ([phone]) if you develop fever > 101, persistent vomiting, or the catheter stops draining. Also, feel free to call with any other questions you may have and remember the handout that was provided to you as a reference preoperatively which answers many of the common questions that arise after surgery. ° °You may resume aspirin, advil, aleve, vitamins, and supplements 7 days after surgery. °

## 2013-10-31 NOTE — Transfer of Care (Signed)
Immediate Anesthesia Transfer of Care Note  Patient: [name]  Procedure(s) Performed: Procedure(s): ROBOTIC ASSISTED LAPAROSCOPIC RADICAL PROSTATECTOMY WITH INDOCYANINE GREEN DYE (N/A) LYMPHADENECTOMY  BILATERAL PELVIC LYMPH NODE DISSECTION (Bilateral)  Patient Location: PACU  Anesthesia Type:General  Level of Consciousness: awake, alert , oriented and patient cooperative  Airway & Oxygen Therapy: Patient Spontanous Breathing and Patient connected to face mask oxygen  Post-op Assessment: Report given to PACU RN, Post -op Vital signs reviewed and stable and Patient moving all extremities  Post vital signs: Reviewed and stable  Complications: No apparent anesthesia complications

## 2013-10-31 NOTE — Brief Op Note (Signed)
10/31/2013  10:16 AM  PATIENT:  [name]  58 y.o. male  PRE-OPERATIVE DIAGNOSIS:  PROSTATE CANCER  POST-OPERATIVE DIAGNOSIS:  PROSTATE CANCER  PROCEDURE:  Procedure(s): ROBOTIC ASSISTED LAPAROSCOPIC RADICAL PROSTATECTOMY WITH INDOCYANINE GREEN DYE (N/A) LYMPHADENECTOMY  BILATERAL PELVIC LYMPH NODE DISSECTION (Bilateral)  SURGEON:  Surgeon(s) and Role:    * Alexis Frock, MD - Primary  PHYSICIAN ASSISTANT:   ASSISTANTS: Felipa Furnace, PA   ANESTHESIA:   general  EBL:  Total I/O In: 1000 [I.V.:1000] Out: 29 [Blood:50]  BLOOD ADMINISTERED:none  DRAINS: 1 - foley, 2 - JP to bulb suction   LOCAL MEDICATIONS USED:  MARCAINE     SPECIMEN:  Source of Specimen:  prostate, bilateral lymph nodes, left bladder neck,   DISPOSITION OF SPECIMEN:  PATHOLOGY  COUNTS:  YES  TOURNIQUET:  * No tourniquets in log *  DICTATION: .Other Dictation: Dictation Number [id number]  PLAN OF CARE: Admit to inpatient   PATIENT DISPOSITION:  PACU - hemodynamically stable.   Delay start of Pharmacological VTE agent (>24hrs) due to surgical blood loss or risk of bleeding: yes

## 2013-10-31 NOTE — Anesthesia Postprocedure Evaluation (Signed)
Anesthesia Post Note  Patient: [name]  Procedure(s) Performed: Procedure(s) (LRB): ROBOTIC ASSISTED LAPAROSCOPIC RADICAL PROSTATECTOMY WITH INDOCYANINE GREEN DYE (N/A) LYMPHADENECTOMY  BILATERAL PELVIC LYMPH NODE DISSECTION (Bilateral)  Anesthesia type: General  Patient location: PACU  Post pain: Pain level controlled  Post assessment: Post-op Vital signs reviewed  Last Vitals:  Filed Vitals:   10/31/13 1424  BP: 132/63  Pulse: 76  Temp: 36.5 C  Resp: 16    Post vital signs: Reviewed  Level of consciousness: sedated  Complications: No apparent anesthesia complications

## 2013-11-01 LAB — BASIC METABOLIC PANEL
BUN: 7 mg/dL (ref 6–23)
CHLORIDE: 104 meq/L (ref 96–112)
CO2: 28 meq/L (ref 19–32)
Calcium: 8.2 mg/dL — ABNORMAL LOW (ref 8.4–10.5)
Creatinine, Ser: 0.96 mg/dL (ref 0.50–1.35)
GFR calc Af Amer: >90 mL/min (ref >90)
GFR calc non Af Amer: >90 mL/min (ref >90)
GLUCOSE: 110 mg/dL — AB (ref 70–99)
POTASSIUM: 3.9 meq/L (ref 3.7–5.3)
Sodium: 141 mEq/L (ref 137–147)

## 2013-11-01 LAB — CREATININE, FLUID (PLEURAL, PERITONEAL, JP DRAINAGE): Creat, Fluid: 0.9 mg/dL

## 2013-11-01 LAB — HEMOGLOBIN AND HEMATOCRIT, BLOOD
HCT: 34 % — ABNORMAL LOW (ref 39.0–52.0)
Hemoglobin: 11.5 g/dL — ABNORMAL LOW (ref 13.0–17.0)

## 2013-11-01 NOTE — Discharge Summary (Signed)
Physician Discharge Summary  Patient ID: [name] MRN: [id number] DOB/AGE: May 24, 1956 58 y.o.  Admit date: 10/31/2013 Discharge date: 11/01/2013  Admission Diagnoses: Moderate Risk Prostate Cancer  Discharge Diagnoses: Moderate Risk Prostate Cancer Active Problems:   Malignant neoplasm of prostate   Discharged Condition: good  Hospital Course:   1 - Moderate Risk Prostate Cancer - s/p robotic assisted laparoscopic radical prostatectomy with ICG lymphangiography and pelvic lymphadenectomy 10/31/13, the day of admission. By 4/18, the day of discharge, he is ambulatory, pain controlled with PO meds, tolerating regular diet, JP removed as output minimal and Cr same as serum, and felt to be adequate for discharge.   Consults: None  Significant Diagnostic Studies: labs: JP Cr <1.5, Pathology - pending  Treatments: surgery: robotic assisted laparoscopic radical prostatectomy with ICG lymphangiography and pelvic lymphadenectomy 10/31/13  Discharge Exam: Blood pressure 105/55, pulse 80, temperature 98.8 F (37.1 C), temperature source Oral, resp. rate 16, height 5\' 4"  (1.626 m), weight 80.3 kg (177 lb 0.5 oz), SpO2 96.00%. General appearance: alert, cooperative and appears stated age Head: Normocephalic, without obvious abnormality, atraumatic Nose: Nares normal. Septum midline. Mucosa normal. No drainage or sinus tenderness. Throat: lips, mucosa, and tongue normal; teeth and gums normal Neck: supple, symmetrical, trachea midline Back: symmetric, no curvature. ROM normal. No CVA tenderness. Resp: no labored breathing Chest wall: no tenderness Cardio: regular rate and rhythm, S1, S2 normal, no murmur, click, rub or gallop GI: soft, non-tender; bowel sounds normal; no masses,  no organomegaly Male genitalia: normal, foley c/d/i with clear yellow urine. Extremities: extremities normal, atraumatic, no cyanosis or edema Pulses: 2+ and symmetric Skin: Skin color, texture, turgor  normal. No rashes or lesions Lymph nodes: Cervical, supraclavicular, and axillary nodes normal. Neurologic: Grossly normal Incision/Wound: Recent port sites and extraction sites c/d/i. JP removed and dry dressing applied.  Disposition: 01-Home or Self Care     Medication List    STOP taking these medications       ibuprofen 600 MG tablet  Commonly known as:  ADVIL,MOTRIN     multivitamin with minerals Tabs tablet     traMADol 50 MG tablet  Commonly known as:  ULTRAM      TAKE these medications       ALPRAZolam 1 MG tablet  Commonly known as:  XANAX  Take 0.5-1 mg by mouth at bedtime as needed for sleep.     amLODipine 10 MG tablet  Commonly known as:  NORVASC  Take 10 mg by mouth every morning.     ciprofloxacin 500 MG tablet  Commonly known as:  CIPRO  Take 1 tablet (500 mg total) by mouth 2 (two) times daily. Start day prior to office visit for foley removal     cloNIDine 0.1 MG tablet  Commonly known as:  CATAPRES  Take 0.1 mg by mouth every morning.     hydrOXYzine 25 MG capsule  Commonly known as:  VISTARIL  Take 25 mg by mouth 2 (two) times daily as needed for anxiety.     oxyCODONE-acetaminophen 5-325 MG per tablet  Commonly known as:  ROXICET  Take 1-2 tablets by mouth every 6 (six) hours as needed for severe pain.           Follow-up Information   Follow up with Alexis Frock, MD On 11/06/2013. (at 11:00)    Specialty:  Urology   Contact information:   [facility]  [place] [address] [phone]  Signed: Alexis Frock 11/01/2013, 11:57 AM

## 2013-11-01 NOTE — Op Note (Signed)
[name], [name]              ACCOUNT NO.:  [id number]  MEDICAL RECORD NO.:  [id number]  LOCATION:  6578                         FACILITY:  [facility]  PHYSICIAN:  Alexis Frock, MD     DATE OF BIRTH:  1956/05/12  DATE OF PROCEDURE: 10/31/2013 DATE OF DISCHARGE:                              OPERATIVE REPORT   DIAGNOSIS:  Moderate risk prostate cancer.  PROCEDURES: 1. Robotic-assisted laparoscopic radical prostatectomy. 2. Bilateral pelvic lymphadenectomy. 3. Indocyanine green dye lymphangiography with interpretation.  ASSISTANT:  Leta Baptist, PA  ESTIMATED BLOOD LOSS:  50 mL.  COMPLICATIONS:  None.  SPECIMENS: 1. Radical prostatectomy. 2. Periprostatic fat. 3. Right external iliac lymph nodes. 4. Right obturator lymph nodes. 5. Right perivesical lymph node, sentinel. 6. Left external iliac lymph nodes. 7. Left obturator lymph nodes. 8. Left bladder neck frozen section, negative for carcinoma.  DRAINS: 1. Foley catheter to straight drain. 2. Jackson-Pratt drain to bulb suction.  INDICATION:  [name] is a 58 year old gentleman with history of elevated PSA.  He was found on workup of this to have moderate risk prostate cancer.  Options were discussed including surveillance versus surgical extirpation with and without minimally invasive assistance versus radiotherapy, and he wished to proceed with surgery.  Informed consent was obtained and placed in medical record.  Given his moderate risk disease, it was felt that aggressive lymphadenectomy was indicated as well.  PROCEDURE IN DETAIL:  The patient being [name], was verified. Procedure being radical prostatectomy with pelvic lymphadenectomy and ICG lymphangiography was confirmed.  Procedure was carried out.  Time- out was performed.  Intravenous antibiotics were administered.  General endotracheal anesthesia was introduced.  The patient was placed into a low lithotomy position, and sterile field was  created by prepping and draping the patient's infraxiphoid abdomen using chlorhexidine gluconate and his penis, perineum and proximal thighs using iodine.  He has further fashioned on the operative table using 3-inch tape over foam. He was placed into a steep Trendelenburg position and found to be stable in the operative table.  A high-flow low-pressure pneumoperitoneum was obtained using Veress technique in the infraumbilical midline having passed the aspiration and drop test.  Next, a 12-mm robotic camera port was placed in this location.  Laparoscopic examination of the peritoneal cavity revealed some sigmoid adhesions in the left lower quadrant that appeared to be rather thin and simple.  Additional ports were then placed as follows; right paramedian 8-mm robotic port, right far lateral 12-mm assistant port, left paramedian 8-mm robotic port, left far lateral 8-mm robotic port, right paramedian 5-mm suction port.  Robot was docked and passed through electronic checks.  Initial attention was directed at adhesiolysis.  Very careful adhesiolysis was performed of sigmoid attachments to the left lower quadrant in the area of the medial umbilical ligament and iliac vessels, and this was carefully swept away from this area to access to this area for later lymphangiectomy.  Next, incision was made lateral to the left medial umbilical ligament from the midline towards the area of the internal ring coursing across the iliac vessels and the left bladder wall was carefully swept away from the pelvic sidewall and towards the area of the  endopelvic fascia.  A mirror image dissection was performed on the right side.  Anterior attachments were then taken down, further developed in the space of Retzius using cautery scissors.  This exposed the anterior base of the prostate, which was defatted and set aside, labeled periprostatic fat.  Next, 0.2 mL of indocyanine green dye was injected into each lobe of  the prostate using a percutaneous robotically-guided approach with aspiration in between. Injections to prevent spillage and this did not occur grossly.  Next, the endopelvic fascia was carefully swept away from the lateral aspect of the prostate in a base-to-apex orientation bilaterally, this exposed the dorsal venous complex, which was controlled using vascular load stapler, which showed an excellent hemostatic control of the dorsal venous complex and no injury to the membranous urethra.  As it had been approximately 15 minutes post dye injection, ICG lymphangiography was performed.  Inspection of the pelvis under near infrared fluorescence light revealed no evidence of hyperfluorescent lymph nodes in the obturator, external iliac, internal iliac or common iliac distributions bilaterally.  There was a single hyperfluorescent lymphatic channel coursing along the right perivesical area with several conglomerations along this, which likely represented very small sentinel nodes in this distribution.  As such, standard template lymphangiectomy was performed first on the left side with external iliac group confines being the genitofemoral nerve, ureter medially and pelvic sidewall laterally.  Lymphostasis was achieved with cold clips.  This was set aside, labeled left external iliac lymph nodes, left obturator group similarly controlled.  The confines being obturator nerve, pelvic side wall, external iliac vessels.  Lymphostasis was achieved with cold clips.  This set aside, labelled left external iliac lymph nodes.  The obturator nerve was inspected following these maneuvers and was found to be grossly intact.  Similarly, a mirror image lymphangiectomy was performed on the right side of the right external iliac and obturator lymph node groups.  Lymphostasis was achieved with cold clips once again.  The obturator nerve in the right side was also inspected and found to be grossly uninjured  following these maneuvers. Next, the fiber fatty tissue corresponding to the tissue overlying the sentinel lymphatic channel on the right perivesical location was carefully mobilized and this was set aside, labeled the right perivesical lymph nodes as sentinel.  Next, the bladder neck was identified by moving the Foley catheter back and forth, and bladder neck was separated from the base of the prostate in the anterior-posterior direction keeping what appeared to be a rim of circular bladder neck fibers on both the bladder neck site and the prostatic side thus performing bladder neck preservation.  Next, posterior dissection was performed by incising approximately 7 mm inferior-posterior to the posterior lip of the bladder neck and entering this space of Denonviller The bilateral vas deferens were encountered, dissected for distance of 3 cm, ligated and placed on gentle superior traction.  Next, bilateral seminal vesicles were carefully dissected towards their tips and also placed on gentle superior traction.  The plane of the denonviller  was further developed pushing perirectal tissue posteriorly.  This exposed the neurovascular pedicles bilaterally.  The vascular pedicles were controlled using a sequential clipping technique.  There was no obvious worrisome tissue for extra prostatic extension of tumor as such aggressive nerve sparing was performed on both sides sweeping the presumed neurovascular bundles away from the lateral aspects of the prostate from the mid gland towards the apex.  This exposed the membranous urethra, which was carefully transected using cold scissors.  This completely freed up the radical prostatectomy.  Specimen was placed in EndoCatch bag for later retrieval.  Digital rectal exam was performed with indicator glove and there was no rectal violation seen.  Next, posterior reconstruction was performed using a 3-0 V-Loc suture reapproximating the posterior urethral  plate to the posterior bladder neck bringing these structures in a tension-free apposition.  Next, mucosa-to-bladder anastomosis was performed using double-armed V-Loc suture bringing the bladder neck and membranous urethra into tension- free apposition.  A new Foley catheter was placed per urethra, which irrigated quantitatively and no leak was grossly seen.  All sponge and needle counts were correct.  The closed suction drain was brought through the previous left lateral most robotic port site.  Robot was then undocked.  Specimen was retrieved by extending the camera port site for total distance approximately 2 fingerbreadths and removing the radical prostatectomy specimen.  This site was closed at the level of the fascia using figure-of-eight PDS x3 and at the level of Scarpa's using running Vicryl.  All incisions were infiltrated with dilute lipolyzed Marcaine, 40 mL in total and reapproximated the level of the skin using subcuticular Monocryl followed by Dermabond.  Procedure was then terminated.  The patient tolerated the procedure well.  There were no immediate periprocedural complications, and the patient was taken to the postanesthesia care unit in stable condition.          ______________________________ Alexis Frock, MD     TM/MEDQ  D:  10/31/2013  T:  11/01/2013  Job:  [id number]

## 2013-11-03 ENCOUNTER — Encounter (HOSPITAL_COMMUNITY): Payer: Self-pay | Admitting: Urology
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
## 2016-05-23 ENCOUNTER — Ambulatory Visit (INDEPENDENT_AMBULATORY_CARE_PROVIDER_SITE_OTHER): Payer: BLUE CROSS/BLUE SHIELD | Admitting: Neurology

## 2016-05-23 ENCOUNTER — Encounter: Payer: Self-pay | Admitting: Neurology

## 2016-05-23 VITALS — BP 114/66 | HR 68 | Ht 64.0 in | Wt 148.0 lb

## 2016-05-23 DIAGNOSIS — M5440 Lumbago with sciatica, unspecified side: Secondary | ICD-10-CM

## 2016-05-23 DIAGNOSIS — R251 Tremor, unspecified: Secondary | ICD-10-CM | POA: Diagnosis not present

## 2016-05-23 DIAGNOSIS — G8929 Other chronic pain: Secondary | ICD-10-CM

## 2017-02-27 ENCOUNTER — Telehealth: Payer: Self-pay | Admitting: Neurology

## 2017-02-27 NOTE — Telephone Encounter (Signed)
Called patient to let him know. He will call PCP. Thanks

## 2017-02-27 NOTE — Telephone Encounter (Signed)
Left message on machine for patient to call back.   To let him know he never saw Dr. Carles Collet for cognitive issues, he will need referral from PCP to Dr. Si Raider to evaluate.
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
## 2017-03-02 ENCOUNTER — Encounter: Payer: Self-pay | Admitting: Psychology

## 2017-06-26 ENCOUNTER — Encounter: Payer: BLUE CROSS/BLUE SHIELD | Admitting: Psychology

## 2017-06-29 ENCOUNTER — Ambulatory Visit: Payer: BLUE CROSS/BLUE SHIELD | Admitting: Psychology

## 2017-06-29 DIAGNOSIS — F419 Anxiety disorder, unspecified: Secondary | ICD-10-CM

## 2017-06-29 DIAGNOSIS — F1021 Alcohol dependence, in remission: Secondary | ICD-10-CM

## 2017-06-29 DIAGNOSIS — S069X5S Unspecified intracranial injury with loss of consciousness greater than 24 hours with return to pre-existing conscious level, sequela: Secondary | ICD-10-CM

## 2017-06-29 DIAGNOSIS — R413 Other amnesia: Secondary | ICD-10-CM
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
## 2017-07-01 ENCOUNTER — Encounter: Payer: Self-pay | Admitting: Psychology

## 2017-07-03 ENCOUNTER — Encounter: Payer: Self-pay | Admitting: Psychology

## 2017-07-03 ENCOUNTER — Ambulatory Visit (INDEPENDENT_AMBULATORY_CARE_PROVIDER_SITE_OTHER): Payer: BLUE CROSS/BLUE SHIELD | Admitting: Psychology

## 2017-07-03 DIAGNOSIS — S069X5S Unspecified intracranial injury with loss of consciousness greater than 24 hours with return to pre-existing conscious level, sequela: Secondary | ICD-10-CM

## 2017-07-03 DIAGNOSIS — R413 Other amnesia: Secondary | ICD-10-CM

## 2017-07-03 NOTE — Progress Notes (Signed)
   Neuropsychology Note  [name] returned today for 3 hours of neuropsychological testing with technician, Milana Kidney, BS, under the supervision of Dr. Macarthur Critchley. The patient did not appear overtly distressed by the testing session, per behavioral observation or via self-report to the technician. Rest breaks were offered. [name] will return within 2 weeks for a feedback session with Dr. Si Raider at which time his test performances, clinical impressions and treatment recommendations will be reviewed in detail. The patient understands he can contact our office should he require our assistance before this time.  Full report to follow.

## 2017-07-05 ENCOUNTER — Encounter: Payer: BLUE CROSS/BLUE SHIELD | Admitting: Psychology

## 2017-07-20 ENCOUNTER — Telehealth: Payer: Self-pay | Admitting: Psychology

## 2017-07-20 NOTE — Telephone Encounter (Signed)
Patient wants to you to know that he does have any imagination
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
## 2017-07-23 ENCOUNTER — Ambulatory Visit: Payer: BLUE CROSS/BLUE SHIELD | Admitting: Psychology

## 2017-07-23 ENCOUNTER — Encounter: Payer: Self-pay | Admitting: Psychology

## 2017-07-23 DIAGNOSIS — F0280 Dementia in other diseases classified elsewhere without behavioral disturbance: Secondary | ICD-10-CM

## 2017-07-23 DIAGNOSIS — S069X5S Unspecified intracranial injury with loss of consciousness greater than 24 hours with return to pre-existing conscious level, sequela: Secondary | ICD-10-CM | POA: Diagnosis not present

## 2017-07-23 NOTE — Patient Instructions (Signed)
Cognitive testing results showed deficits in processing speed, mental flexibility/set-shifting, and free recall of new information. There were also mild deficits in auditory attention and visual-spatial construction.  Relative strengths were noted in non-verbal abstract reasoning, recognition memory (you benefit from cueing and recognition format to retrieve newly learned information), confrontation naming, and auditory comprehension.   The areas of deficits described above are most likely due to your history of brain injury. Subsequent drug/alcohol abuse may have exacerbated cognitive symptoms.   I do not see evidence of superimposed neurodegenerative dementia like Alzheimer's disease.  The formal diagnosis is "major neurocognitive disorder due to TBI".  In addition to cognitive impairment, you described possible mood/personality changes that are common after brain injury. After a brain injury, individuals commonly experience reduced frustration tolerance, irritability, impulsivity and transient depressed/anxious mood.   However, your responses on extensive self-report measures did not suggest the presence of a primary psychiatric disorder.      Recommendations/Plan: (included in the full report) 1. He may benefit from attending a brain injury support group. 2. He will likely need accommodations for his cognitive impairments in order to maintain gainful employment. For example, due to slowed processing speed, he will require increased time to complete tasks and will likely need more breaks. Due to executive dysfunction, he will need clear step by step instructions and will not be able to multi-task effectively. Due to memory retrieval difficulties, he will need reference materials to look back at. 3. He is encouraged to maintain sobriety as substance abuse will increase his risk of seizure and worsening cognitive impairment. 4. We would not expect symptoms of a TBI to worsen over time unless  there is a superimposed dementia or other neuropathology. If there is a worsening of mental/cognitive function, neuropsychological re-evaluation will assist in determining etiology and making additional treatment recommendations.  5. Although I do not see evidence of a primary psychiatric disorder, psychiatry consultation could be considered to evaluate for more effective treatment of behavioral symptoms of TBI.

## 2017-08-07 ENCOUNTER — Encounter: Payer: Self-pay | Admitting: Gastroenterology

## 2017-09-14 DIAGNOSIS — Z860100 Personal history of colon polyps, unspecified: Secondary | ICD-10-CM

## 2017-09-14 DIAGNOSIS — K648 Other hemorrhoids: Secondary | ICD-10-CM

## 2017-09-14 DIAGNOSIS — K5790 Diverticulosis of intestine, part unspecified, without perforation or abscess without bleeding: Secondary | ICD-10-CM

## 2017-09-14 HISTORY — DX: Other hemorrhoids: K64.8

## 2017-09-14 HISTORY — DX: Personal history of colonic polyps: Z86.010

## 2017-09-14 HISTORY — DX: Personal history of colon polyps, unspecified: Z86.0100

## 2017-09-14 HISTORY — DX: Diverticulosis of intestine, part unspecified, without perforation or abscess without bleeding: K57.90

## 2017-09-17 ENCOUNTER — Other Ambulatory Visit: Payer: Self-pay

## 2017-09-17 ENCOUNTER — Ambulatory Visit (AMBULATORY_SURGERY_CENTER): Payer: Self-pay

## 2017-09-17 VITALS — Ht 64.0 in | Wt 151.4 lb

## 2017-09-17 DIAGNOSIS — K625 Hemorrhage of anus and rectum: Secondary | ICD-10-CM

## 2017-09-17 NOTE — Progress Notes (Signed)
No egg or soy allergy known to patient  No issues with past sedation with any surgeries  or procedures, no intubation problems  No diet pills per patient No home 02 use per patient  No blood thinners per patient  Pt denies issues with constipation  No A fib or A flutter  EMMI video sent to pt's e mail pt declined   

## 2017-09-18 ENCOUNTER — Encounter: Payer: Self-pay | Admitting: Gastroenterology

## 2017-09-24 ENCOUNTER — Other Ambulatory Visit: Payer: Self-pay

## 2017-09-24 ENCOUNTER — Ambulatory Visit (AMBULATORY_SURGERY_CENTER): Payer: BLUE CROSS/BLUE SHIELD | Admitting: Gastroenterology

## 2017-09-24 ENCOUNTER — Encounter: Payer: Self-pay | Admitting: Gastroenterology

## 2017-09-24 VITALS — BP 101/59 | HR 60 | Temp 96.8°F | Resp 10 | Ht 64.0 in | Wt 151.0 lb

## 2017-09-24 DIAGNOSIS — Z1211 Encounter for screening for malignant neoplasm of colon: Secondary | ICD-10-CM | POA: Diagnosis present

## 2017-09-24 DIAGNOSIS — D125 Benign neoplasm of sigmoid colon: Secondary | ICD-10-CM

## 2017-09-24 DIAGNOSIS — K635 Polyp of colon: Secondary | ICD-10-CM

## 2017-09-24 HISTORY — PX: COLONOSCOPY: SHX174

## 2017-09-24 MED ORDER — SODIUM CHLORIDE 0.9 % IV SOLN: 500.0000 mL | Freq: Once | INTRAVENOUS | Status: AC

## 2017-09-24 NOTE — Op Note (Signed)
[place] Patient Name: [name] Procedure Date: 09/24/2017 8:37 AM MRN: [id number] Endoscopist: Remo Lipps P. Madden Piazza MD, MD Age: 62 Referring MD:  Date of Birth: [birth year] Gender: Male Account #: [id number] Procedure:                Colonoscopy Indications:              Screening for colorectal malignant neoplasm, This                            is the patient's first colonoscopy Medicines:                Monitored Anesthesia Care Procedure:                Pre-Anesthesia Assessment:                           - Prior to the procedure, a History and Physical                            was performed, and patient medications and                            allergies were reviewed. The patient's tolerance of                            previous anesthesia was also reviewed. The risks                            and benefits of the procedure and the sedation                            options and risks were discussed with the patient.                            All questions were answered, and informed consent                            was obtained. Prior Anticoagulants: The patient has                            taken no previous anticoagulant or antiplatelet                            agents. ASA Grade Assessment: III - A patient with                            severe systemic disease. After reviewing the risks                            and benefits, the patient was deemed in                            satisfactory condition to undergo the procedure.  After obtaining informed consent, the colonoscope                            was passed under direct vision. Throughout the                            procedure, the patient's blood pressure, pulse, and                            oxygen saturations were monitored continuously. The                            Colonoscope was introduced through the anus and                            advanced to the  the cecum, identified by                            appendiceal orifice and ileocecal valve. The                            colonoscopy was performed without difficulty. The                            patient tolerated the procedure well. The quality                            of the bowel preparation was adequate. The                            ileocecal valve, appendiceal orifice, and rectum                            were photographed. Scope In: 8:44:08 AM Scope Out: 9:02:33 AM Scope Withdrawal Time: 0 hours 14 minutes 45 seconds  Total Procedure Duration: 0 hours 18 minutes 25 seconds  Findings:                 The perianal and digital rectal examinations were                            normal.                           Scattered medium-mouthed diverticula were found in                            the entire colon.                           A 4 mm polyp was found in the sigmoid colon. The                            polyp was sessile. The polyp was removed with a  cold snare. Resection and retrieval were complete.                           Internal hemorrhoids were found during retroflexion.                           The exam was otherwise without abnormality. Complications:            No immediate complications. Estimated blood loss:                            Minimal. Estimated Blood Loss:     Estimated blood loss was minimal. Impression:               - Diverticulosis in the entire examined colon.                           - One 4 mm polyp in the sigmoid colon, removed with                            a cold snare. Resected and retrieved.                           - Internal hemorrhoids.                           - The examination was otherwise normal. Recommendation:           - Patient has a contact number available for                            emergencies. The signs and symptoms of potential                            delayed complications were  discussed with the                            patient. Return to normal activities tomorrow.                            Written discharge instructions were provided to the                            patient.                           - Resume previous diet.                           - Continue present medications.                           - Await pathology results.                           - Repeat colonoscopy is recommended for  surveillance. The colonoscopy date will be                            determined after pathology results from today's                            exam become available for review. Remo Lipps P. Kenneth Carraway MD, MD 09/24/2017 9:05:14 AM This report has been signed electronically.

## 2017-09-24 NOTE — Progress Notes (Signed)
Called to room to assist during endoscopic procedure.  Patient ID and intended procedure confirmed with present staff. Received instructions for my participation in the procedure from the performing physician.  

## 2017-09-24 NOTE — Progress Notes (Signed)
To PACU, VSS. Report to [email or web address] 

## 2017-09-24 NOTE — Patient Instructions (Signed)
Impression/Recommendations:  Polyp handout given to patient. Diverticulosis handout given to patient. Hemorrhoid handout given to patient.  Resume previous diet. Continue present medications.  Repeat colonoscopy recommended for surveillance.  Date to be determined after pathology results reviewed.  YOU HAD AN ENDOSCOPIC PROCEDURE TODAY AT THE [facility]:   Refer to the procedure report that was given to you for any specific questions about what was found during the examination.  If the procedure report does not answer your questions, please call your gastroenterologist to clarify.  If you requested that your care partner not be given the details of your procedure findings, then the procedure report has been included in a sealed envelope for you to review at your convenience later.  YOU SHOULD EXPECT: Some feelings of bloating in the abdomen. Passage of more gas than usual.  Walking can help get rid of the air that was put into your GI tract during the procedure and reduce the bloating. If you had a lower endoscopy (such as a colonoscopy or flexible sigmoidoscopy) you may notice spotting of blood in your stool or on the toilet paper. If you underwent a bowel prep for your procedure, you may not have a normal bowel movement for a few days.  Please Note:  You might notice some irritation and congestion in your nose or some drainage.  This is from the oxygen used during your procedure.  There is no need for concern and it should clear up in a day or so.  SYMPTOMS TO REPORT IMMEDIATELY:   Following lower endoscopy (colonoscopy or flexible sigmoidoscopy):  Excessive amounts of blood in the stool  Significant tenderness or worsening of abdominal pains  Swelling of the abdomen that is new, acute  Fever of 100F or higher  For urgent or emergent issues, a gastroenterologist can be reached at any hour by calling [phone].   DIET:  We do recommend a small meal at first, but then  you may proceed to your regular diet.  Drink plenty of fluids but you should avoid alcoholic beverages for 24 hours.  ACTIVITY:  You should plan to take it easy for the rest of today and you should NOT DRIVE or use heavy machinery until tomorrow (because of the sedation medicines used during the test).    FOLLOW UP: Our staff will call the number listed on your records the next business day following your procedure to check on you and address any questions or concerns that you may have regarding the information given to you following your procedure. If we do not reach you, we will leave a message.  However, if you are feeling well and you are not experiencing any problems, there is no need to return our call.  We will assume that you have returned to your regular daily activities without incident.  If any biopsies were taken you will be contacted by phone or by letter within the next 1-3 weeks.  Please call us at [phone] if you have not heard about the biopsies in 3 weeks.    SIGNATURES/CONFIDENTIALITY: You and/or your care partner have signed paperwork which will be entered into your electronic medical record.  These signatures attest to the fact that that the information above on your After Visit Summary has been reviewed and is understood.  Full responsibility of the confidentiality of this discharge information lies with you and/or your care-partner. 

## 2017-09-24 NOTE — Progress Notes (Signed)
Pt's states no medical or surgical changes since previsit or office visit. 

## 2017-09-25 ENCOUNTER — Telehealth: Payer: Self-pay

## 2017-09-25 NOTE — Telephone Encounter (Signed)
  Follow up Call-  Call back number 09/24/2017  Post procedure Call Back phone  # [phone]  Permission to leave phone message Yes  Some recent data might be hidden     Patient questions:  Do you have a fever, pain , or abdominal swelling? No. Pain Score  0 *  Have you tolerated food without any problems? Yes.    Have you been able to return to your normal activities? Yes.    Do you have any questions about your discharge instructions: Diet   No. Medications  No. Follow up visit  No.  Do you have questions or concerns about your Care? No.  Actions: * If pain score is 4 or above: No action needed, pain <4.

## 2017-09-25 NOTE — Telephone Encounter (Signed)
  Follow up Call-  Call back number 09/24/2017  Post procedure Call Back phone  # [phone]  Permission to leave phone message Yes  Some recent data might be hidden     Left message

## 2017-09-27 ENCOUNTER — Encounter: Payer: Self-pay | Admitting: Gastroenterology

## 2018-03-22 ENCOUNTER — Other Ambulatory Visit: Payer: Self-pay | Admitting: Urology

## 2018-04-24 ENCOUNTER — Other Ambulatory Visit: Payer: Self-pay

## 2018-04-24 ENCOUNTER — Encounter (HOSPITAL_BASED_OUTPATIENT_CLINIC_OR_DEPARTMENT_OTHER): Payer: Self-pay

## 2018-04-24 NOTE — Progress Notes (Signed)
Spoke with: [name] NPO: No food after midnight/Clear liquids until 7:00AM  DOS, No smoking after midnight Arrival time: 1100AM Labs: Istat 8, EKG AM medications:  Amlodipine, Clonidine, Valium, Hydrocodone Pre op orders:  Yes Ride home:  [name] (mom)  Patient informed no smoking facility since he has to stay overnight verbalized understanding

## 2018-04-25 NOTE — Progress Notes (Signed)
Instructed on CHG bath the night before and morning of surgery.

## 2018-04-29 ENCOUNTER — Observation Stay (HOSPITAL_BASED_OUTPATIENT_CLINIC_OR_DEPARTMENT_OTHER)
Admission: RE | Admit: 2018-04-29 | Discharge: 2018-04-30 | Disposition: A | Discharge status: Home / Self Care | Payer: BLUE CROSS/BLUE SHIELD | Source: Ambulatory Visit | Attending: Urology | Admitting: Urology

## 2018-04-29 ENCOUNTER — Other Ambulatory Visit: Payer: Self-pay

## 2018-04-29 ENCOUNTER — Ambulatory Visit (HOSPITAL_BASED_OUTPATIENT_CLINIC_OR_DEPARTMENT_OTHER): Payer: BLUE CROSS/BLUE SHIELD | Admitting: Anesthesiology

## 2018-04-29 ENCOUNTER — Encounter (HOSPITAL_BASED_OUTPATIENT_CLINIC_OR_DEPARTMENT_OTHER)
Admission: RE | Disposition: A | Discharge status: Home / Self Care | Payer: Self-pay | Source: Ambulatory Visit | Attending: Urology

## 2018-04-29 ENCOUNTER — Encounter (HOSPITAL_BASED_OUTPATIENT_CLINIC_OR_DEPARTMENT_OTHER): Payer: Self-pay | Admitting: *Deleted

## 2018-04-29 DIAGNOSIS — E785 Hyperlipidemia, unspecified: Secondary | ICD-10-CM | POA: Diagnosis not present

## 2018-04-29 DIAGNOSIS — N5231 Erectile dysfunction following radical prostatectomy: Secondary | ICD-10-CM | POA: Diagnosis not present

## 2018-04-29 DIAGNOSIS — I10 Essential (primary) hypertension: Secondary | ICD-10-CM | POA: Diagnosis not present

## 2018-04-29 DIAGNOSIS — M545 Low back pain: Secondary | ICD-10-CM | POA: Diagnosis not present

## 2018-04-29 DIAGNOSIS — F419 Anxiety disorder, unspecified: Secondary | ICD-10-CM | POA: Diagnosis not present

## 2018-04-29 DIAGNOSIS — F17210 Nicotine dependence, cigarettes, uncomplicated: Secondary | ICD-10-CM | POA: Insufficient documentation

## 2018-04-29 DIAGNOSIS — N529 Male erectile dysfunction, unspecified: Secondary | ICD-10-CM | POA: Diagnosis present

## 2018-04-29 DIAGNOSIS — G8929 Other chronic pain: Secondary | ICD-10-CM | POA: Insufficient documentation

## 2018-04-29 DIAGNOSIS — M1990 Unspecified osteoarthritis, unspecified site: Secondary | ICD-10-CM | POA: Insufficient documentation

## 2018-04-29 DIAGNOSIS — N486 Induration penis plastica: Secondary | ICD-10-CM | POA: Diagnosis present

## 2018-04-29 DIAGNOSIS — Z8546 Personal history of malignant neoplasm of prostate: Secondary | ICD-10-CM | POA: Insufficient documentation

## 2018-04-29 DIAGNOSIS — Z885 Allergy status to narcotic agent status: Secondary | ICD-10-CM | POA: Insufficient documentation

## 2018-04-29 DIAGNOSIS — Z882 Allergy status to sulfonamides status: Secondary | ICD-10-CM | POA: Diagnosis not present

## 2018-04-29 HISTORY — DX: Personal history of colonic polyps: Z86.010

## 2018-04-29 HISTORY — DX: Tremor, unspecified: R25.1

## 2018-04-29 HISTORY — DX: Personal history of traumatic brain injury: Z87.820

## 2018-04-29 HISTORY — DX: Presence of spectacles and contact lenses: Z97.3

## 2018-04-29 HISTORY — DX: Diverticulosis of intestine, part unspecified, without perforation or abscess without bleeding: K57.90

## 2018-04-29 HISTORY — PX: PENILE PROSTHESIS IMPLANT: SHX240

## 2018-04-29 HISTORY — DX: Other chronic pain: G89.29

## 2018-04-29 HISTORY — DX: Personal history of (healed) traumatic fracture: Z87.81

## 2018-04-29 HISTORY — DX: Low back pain: M54.5

## 2018-04-29 HISTORY — DX: Malignant neoplasm of prostate: C61

## 2018-04-29 HISTORY — DX: Alcohol abuse, uncomplicated: F10.10

## 2018-04-29 HISTORY — DX: Other specified abnormal findings of blood chemistry: R79.89

## 2018-04-29 HISTORY — DX: Personal history of other diseases of the digestive system: Z87.19

## 2018-04-29 HISTORY — DX: Low back pain, unspecified: M54.50

## 2018-04-29 HISTORY — DX: Abnormal results of liver function studies: R94.5

## 2018-04-29 HISTORY — DX: Other hemorrhoids: K64.8

## 2018-04-29 LAB — POCT I-STAT, CHEM 8
BUN: 8 mg/dL (ref 6–20)
Calcium, Ion: 1.21 mmol/L (ref 1.15–1.40)
Chloride: 101 mmol/L (ref 98–111)
Creatinine, Ser: 0.9 mg/dL (ref 0.61–1.24)
GLUCOSE: 92 mg/dL (ref 70–99)
HEMATOCRIT: 39 % (ref 39.0–52.0)
HEMOGLOBIN: 13.3 g/dL (ref 13.0–17.0)
Potassium: 3.9 mmol/L (ref 3.5–5.1)
SODIUM: 138 mmol/L (ref 135–145)
TCO2: 28 mmol/L (ref 22–32)

## 2018-04-29 LAB — CBC
HCT: 39.3 % (ref 39.0–52.0)
Hemoglobin: 12.6 g/dL — ABNORMAL LOW (ref 13.0–17.0)
MCH: 30.7 pg (ref 26.0–34.0)
MCHC: 32.1 g/dL (ref 30.0–36.0)
MCV: 95.9 fL (ref 80.0–100.0)
NRBC: 0 % (ref 0.0–0.2)
Platelets: 234 10*3/uL (ref 150–400)
RBC: 4.1 MIL/uL — ABNORMAL LOW (ref 4.22–5.81)
RDW: 13.7 % (ref 11.5–15.5)
WBC: 15 10*3/uL — AB (ref 4.0–10.5)

## 2018-04-29 LAB — BASIC METABOLIC PANEL
Anion gap: 9 (ref 5–15)
BUN: 8 mg/dL (ref 6–20)
CO2: 27 mmol/L (ref 22–32)
CREATININE: 0.93 mg/dL (ref 0.61–1.24)
Calcium: 8.9 mg/dL (ref 8.9–10.3)
Chloride: 106 mmol/L (ref 98–111)
GFR calc Af Amer: >60 mL/min (ref >60)
Glucose, Bld: 116 mg/dL — ABNORMAL HIGH (ref 70–99)
POTASSIUM: 4.1 mmol/L (ref 3.5–5.1)
SODIUM: 142 mmol/L (ref 135–145)

## 2018-04-29 SURGERY — INSERTION, PENILE PROSTHESIS, INFLATABLE
Anesthesia: General | Site: Penis

## 2018-04-29 MED ORDER — DIPHENHYDRAMINE HCL 50 MG/ML IJ SOLN
12.5000 mg | Freq: Four times a day (QID) | INTRAMUSCULAR | Status: DC | PRN
  Filled 2018-04-29: qty 0.5

## 2018-04-29 MED ORDER — FENTANYL CITRATE (PF) 100 MCG/2ML IJ SOLN
INTRAMUSCULAR | Status: DC | PRN
  Administered 2018-04-29 (×2): 50 ug via INTRAVENOUS
  Administered 2018-04-29 (×2): 25 ug via INTRAVENOUS
  Administered 2018-04-29: 50 ug via INTRAVENOUS

## 2018-04-29 MED ORDER — DEXAMETHASONE SODIUM PHOSPHATE 4 MG/ML IJ SOLN
INTRAMUSCULAR | Status: DC | PRN
  Administered 2018-04-29: 10 mg via INTRAVENOUS

## 2018-04-29 MED ORDER — OXYCODONE-ACETAMINOPHEN 5-325 MG PO TABS
1.0000 | ORAL_TABLET | ORAL | Status: DC | PRN
  Administered 2018-04-29 (×2): 1 via ORAL
  Administered 2018-04-30: 2 via ORAL
  Filled 2018-04-29: qty 2

## 2018-04-29 MED ORDER — PROPOFOL 10 MG/ML IV BOLUS
INTRAVENOUS | Status: AC
  Filled 2018-04-29: qty 20

## 2018-04-29 MED ORDER — PROPOFOL 10 MG/ML IV BOLUS
INTRAVENOUS | Status: DC | PRN
  Administered 2018-04-29: 160 mg via INTRAVENOUS

## 2018-04-29 MED ORDER — DEXAMETHASONE SODIUM PHOSPHATE 10 MG/ML IJ SOLN
INTRAMUSCULAR | Status: AC
  Filled 2018-04-29: qty 1

## 2018-04-29 MED ORDER — MIDAZOLAM HCL 5 MG/5ML IJ SOLN
INTRAMUSCULAR | Status: DC | PRN
  Administered 2018-04-29: 2 mg via INTRAVENOUS

## 2018-04-29 MED ORDER — LACTATED RINGERS IV SOLN
INTRAVENOUS | Status: DC
  Administered 2018-04-29 (×2): via INTRAVENOUS
  Filled 2018-04-29: qty 1000

## 2018-04-29 MED ORDER — GENTAMICIN SULFATE 40 MG/ML IJ SOLN
5.0000 mg/kg | INTRAVENOUS | Status: AC
  Administered 2018-04-29: 340 mg via INTRAVENOUS
  Filled 2018-04-29 (×2): qty 8.5

## 2018-04-29 MED ORDER — ONDANSETRON HCL 4 MG/2ML IJ SOLN
INTRAMUSCULAR | Status: AC
  Filled 2018-04-29: qty 2

## 2018-04-29 MED ORDER — MIDAZOLAM HCL 2 MG/2ML IJ SOLN
INTRAMUSCULAR | Status: AC
  Filled 2018-04-29: qty 2

## 2018-04-29 MED ORDER — FENTANYL CITRATE (PF) 100 MCG/2ML IJ SOLN
INTRAMUSCULAR | Status: AC
  Filled 2018-04-29: qty 2

## 2018-04-29 MED ORDER — OXYCODONE-ACETAMINOPHEN 5-325 MG PO TABS
ORAL_TABLET | ORAL | Status: AC
  Filled 2018-04-29: qty 1

## 2018-04-29 MED ORDER — GENTAMICIN SULFATE 40 MG/ML IJ SOLN
5.0000 mg/kg | Freq: Once | INTRAVENOUS | Status: DC
  Filled 2018-04-29: qty 8.5

## 2018-04-29 MED ORDER — LIDOCAINE 2% (20 MG/ML) 5 ML SYRINGE
INTRAMUSCULAR | Status: AC
  Filled 2018-04-29: qty 5

## 2018-04-29 MED ORDER — SODIUM CHLORIDE 0.9 % IV SOLN
INTRAVENOUS | Status: DC | PRN
  Administered 2018-04-29: 500 mL

## 2018-04-29 MED ORDER — FENTANYL CITRATE (PF) 100 MCG/2ML IJ SOLN
25.0000 ug | INTRAMUSCULAR | Status: DC | PRN
  Administered 2018-04-29 (×3): 50 ug via INTRAVENOUS
  Filled 2018-04-29: qty 1

## 2018-04-29 MED ORDER — VANCOMYCIN HCL IN DEXTROSE 1-5 GM/200ML-% IV SOLN
1000.0000 mg | Freq: Two times a day (BID) | INTRAVENOUS | Status: AC
  Administered 2018-04-30: 1000 mg via INTRAVENOUS
  Filled 2018-04-29: qty 200

## 2018-04-29 MED ORDER — ONDANSETRON HCL 4 MG/2ML IJ SOLN
INTRAMUSCULAR | Status: DC | PRN
  Administered 2018-04-29: 4 mg via INTRAVENOUS

## 2018-04-29 MED ORDER — ZOLPIDEM TARTRATE 5 MG PO TABS
5.0000 mg | ORAL_TABLET | Freq: Every evening | ORAL | Status: DC | PRN
  Filled 2018-04-29: qty 1

## 2018-04-29 MED ORDER — DIPHENHYDRAMINE HCL 12.5 MG/5ML PO ELIX
12.5000 mg | ORAL_SOLUTION | Freq: Four times a day (QID) | ORAL | Status: DC | PRN
  Filled 2018-04-29: qty 10

## 2018-04-29 MED ORDER — SODIUM CHLORIDE 0.9 % IR SOLN
Status: DC | PRN
  Administered 2018-04-29: 1000 mL

## 2018-04-29 MED ORDER — LIDOCAINE 2% (20 MG/ML) 5 ML SYRINGE
INTRAMUSCULAR | Status: DC | PRN
  Administered 2018-04-29: 100 mg via INTRAVENOUS

## 2018-04-29 MED ORDER — ACETAMINOPHEN 325 MG PO TABS
650.0000 mg | ORAL_TABLET | ORAL | Status: DC | PRN
  Filled 2018-04-29: qty 2

## 2018-04-29 MED ORDER — SODIUM CHLORIDE 0.9 % IV SOLN
INTRAVENOUS | Status: DC
  Administered 2018-04-30: 01:00:00 via INTRAVENOUS
  Filled 2018-04-29 (×2): qty 1000

## 2018-04-29 MED ORDER — BELLADONNA ALKALOIDS-OPIUM 16.2-60 MG RE SUPP
1.0000 | Freq: Four times a day (QID) | RECTAL | Status: DC | PRN
  Filled 2018-04-29: qty 1

## 2018-04-29 MED ORDER — ONDANSETRON HCL 4 MG/2ML IJ SOLN
4.0000 mg | INTRAMUSCULAR | Status: DC | PRN
  Filled 2018-04-29: qty 2

## 2018-04-29 MED ORDER — VANCOMYCIN HCL 10 G IV SOLR
1500.0000 mg | INTRAVENOUS | Status: AC
  Administered 2018-04-29: 1500 mg via INTRAVENOUS
  Filled 2018-04-29 (×2): qty 1500

## 2018-04-29 MED ORDER — ONDANSETRON HCL 4 MG/2ML IJ SOLN
4.0000 mg | Freq: Once | INTRAMUSCULAR | Status: DC | PRN
  Filled 2018-04-29: qty 2

## 2018-04-29 MED ORDER — HYDROMORPHONE HCL 1 MG/ML IJ SOLN
0.5000 mg | INTRAMUSCULAR | Status: DC | PRN
  Filled 2018-04-29: qty 1

## 2018-04-29 SURGICAL SUPPLY — 59 items
ADH SKN CLS APL DERMABOND .7 (GAUZE/BANDAGES/DRESSINGS) ×1
BAG DECANTER FOR FLEXI CONT (MISCELLANEOUS) ×2 IMPLANT
BAG URINE DRAINAGE (UROLOGICAL SUPPLIES) IMPLANT
BLADE SURG 15 STRL LF DISP TIS (BLADE) ×1 IMPLANT
BLADE SURG 15 STRL SS (BLADE) ×2
BNDG GAUZE ELAST 4 BULKY (GAUZE/BANDAGES/DRESSINGS) ×2 IMPLANT
BRIEF STRETCH FOR OB PAD LRG (UNDERPADS AND DIAPERS) ×2 IMPLANT
BRUSH SCRUB EZ  4% CHG (MISCELLANEOUS) ×4
BRUSH SCRUB EZ 4% CHG (MISCELLANEOUS) ×2 IMPLANT
CANISTER SUCT 3000ML PPV (MISCELLANEOUS) ×2 IMPLANT
CATH FOLEY 2WAY SLVR  5CC 16FR (CATHETERS) ×1
CATH FOLEY 2WAY SLVR 5CC 16FR (CATHETERS) ×1 IMPLANT
CHLORAPREP W/TINT 26ML (MISCELLANEOUS) ×2 IMPLANT
CLEANER CAUTERY TIP 5X5 PAD (MISCELLANEOUS) ×1 IMPLANT
COVER BACK TABLE 60X90IN (DRAPES) ×2 IMPLANT
COVER MAYO STAND STRL (DRAPES) ×4 IMPLANT
DERMABOND ADVANCED (GAUZE/BANDAGES/DRESSINGS) ×1
DERMABOND ADVANCED .7 DNX12 (GAUZE/BANDAGES/DRESSINGS) ×1 IMPLANT
DISSECTOR ROUND CHERRY 3/8 STR (MISCELLANEOUS) IMPLANT
DRAPE INCISE IOBAN 66X45 STRL (DRAPES) ×2 IMPLANT
DRAPE LAPAROTOMY 100X72 PEDS (DRAPES) ×2 IMPLANT
Deep Scrotal Retraction System ×1 IMPLANT
ELECT NDL BLADE 2-5/6 (NEEDLE) IMPLANT
ELECT NEEDLE BLADE 2-5/6 (NEEDLE) IMPLANT
ELECT REM PT RETURN 9FT ADLT (ELECTROSURGICAL) ×2
ELECTRODE REM PT RTRN 9FT ADLT (ELECTROSURGICAL) ×1 IMPLANT
GLOVE BIO SURGEON STRL SZ8 (GLOVE) ×2 IMPLANT
GOWN STRL REUS W/TWL XL LVL3 (GOWN DISPOSABLE) ×2 IMPLANT
IMPL RTE SNAPCONE CX 3.0 (Breast) IMPLANT
IMPLANT RTE SNAPCONE CX 3.0 (Breast) ×2 IMPLANT
KIT ACCESSORY AMS 800 ×2 IMPLANT
KIT TURNOVER CYSTO (KITS) ×2 IMPLANT
NEEDLE ELECTRODE (NEEDLE) ×1 IMPLANT
NS IRRIG 500ML POUR BTL (IV SOLUTION) ×2 IMPLANT
PACK BASIN DAY SURGERY FS (CUSTOM PROCEDURE TRAY) ×2 IMPLANT
PAD CLEANER CAUTERY TIP 5X5 (MISCELLANEOUS) ×1
PENCIL BUTTON HOLSTER BLD 10FT (ELECTRODE) ×2 IMPLANT
PLUG CATH AND CAP STER (CATHETERS) ×2 IMPLANT
PUMP PRECONNECT MS 15CM IZ (Miscellaneous) ×1 IMPLANT
RESERVOIR 65ML PENILE PROS (Urological Implant) ×2 IMPLANT
RETRACTOR WILSON SYSTEM (INSTRUMENTS) IMPLANT
SPONGE LAP 4X18 RFD (DISPOSABLE) IMPLANT
SUPPORT SCROTAL LG STRP (MISCELLANEOUS) IMPLANT
SUT MNCRL AB 4-0 PS2 18 (SUTURE) ×2 IMPLANT
SUT VIC AB 2-0 SH 27 (SUTURE)
SUT VIC AB 2-0 SH 27XBRD (SUTURE) IMPLANT
SUT VIC AB 2-0 UR6 27 (SUTURE) ×9 IMPLANT
SUT VIC AB 3-0 PS2 18 (SUTURE) ×2
SUT VIC AB 3-0 PS2 18XBRD (SUTURE) ×1 IMPLANT
SUT VIC AB 3-0 SH 27 (SUTURE) ×2
SUT VIC AB 3-0 SH 27X BRD (SUTURE) ×1 IMPLANT
SYR 10ML LL (SYRINGE) ×4 IMPLANT
SYR 20CC LL (SYRINGE) IMPLANT
SYR 50ML LL SCALE MARK (SYRINGE) ×4 IMPLANT
SYR BULB IRRIGATION 50ML (SYRINGE) ×2 IMPLANT
TRAY DSU PREP LF (CUSTOM PROCEDURE TRAY) ×2 IMPLANT
TUBE CONNECTING 12X1/4 (SUCTIONS) ×2 IMPLANT
WATER STERILE IRR 500ML POUR (IV SOLUTION) ×2 IMPLANT
YANKAUER SUCT BULB TIP NO VENT (SUCTIONS) ×2 IMPLANT

## 2018-04-29 NOTE — Op Note (Signed)
Preoperative diagnosis: Erectile Dysfunction and peyronies disease  Postoperative diagnosis: Same  Procedure: 1. Placement of an AMS 700 3 Piece inflatable penile prosthesis 2. Penile modeling  Attending: Rosie Fate, MD  Anesthesia: General  History of blood loss: Minimal  Antibiotics: Vancomycin and Gentamicin  Drains: 16 french foley  Specimens: none  Findings: 65cc reservoir placed on the left. 18cm cylinder length on right and left. 15cm cylinder with 3cm rear tip extension. No deformity or curvature on cycling of the device after modeling  Indications: Patient is a 62 year old male with a history of erectile dysfunction and Peyronies disease who has failed medical therapy.  We discussed the treatment options and he has elected to pursue penile prosthesis insertion and correction of his dorsal penile curvature.   Procedure in detail: Prior to procedure consent was obtained.  Patient was brought to the operating room and a brief timeout was done to ensure correct patient, correct procedure, correct site.  General anesthesia was administered and patient was placed in supine position. We performed a 10 minute scrub of his genitalia prior to the using alcohol prep.   His genitalia and abdomen was then prepped and draped in usual sterile fashion.  A 16 French foley catheter was placed and the bladder was drained. The penile was then placed on stretch with the aid of a hook through the meatus attached to the lonestar retractor. A 4 cm incision was made at the penoscrotal junction.  We dissected down to the urethra and corporal bodies.  We then placed 2 stay sutures on the medial and lateral aspect of the corporal bodies with 2-0 vicryl. We then used electrocautery to make a 2cm longitudinal incision between the stay sutures on the right corporal body. We then used sequential dilators to dilate the proximal and distal corporal body to 47mm. We the measured the proximal corporal length  which was 9. We then measured the distal corporal length which was 9.  The corporal body was the irrigated with normal saline. We then did a similar technique on the left. The proximal corporal length was 9cm and the distal corporal length was 9cm. We then turned our attention to placing the reservoir. We used blunt dissection along the left spermatic cord to create a space past the inguinal canal. We then placed the reservoir and filled it with 65cc of normal saline and then a rubber shod was placed on the tubing. WE then turned our attention to placing the cylinders. We placed the proximal end of the right cylinder into the corpora and seated it with the aid of the applicator. We the placed the string attached to the distal end of the cylinder through a Tripp needle. We then attached the needle to the furlough and advanced the furlough through the corporal incision up to the glans. The needle was then advanced through the glans and the string was then secured with a snap. Once cylinder was seated int he corpora we then tied the stay sutures over the cylinder. We then turned our attention to the left corpora. A similar technique was used to place the left cylinder. Once this was complete we then tested the device and noted no leaks and a straight erection.We then deflated the device. We then proceeded to make a subdartos pocket in the scrotum for the pump. Once this was complete the pump was placed the the pouch and the pouch was then closed with a running 2-0 Vicryl. We then turned our attention to connecting the device.  The tubing was cut to length and then the locking clips were placed on either end of the tubing. A barrel connector was then placed on on end of the tubing. The tubing was then irrigated with normal saline to ensure no air bubbles in the tubing. The free end of the barrel was then attached to the reservoir tubing and using the crimping tool the barrel was secured to the tubing. We then closed the  dartos over the tubing suing a 2-0 vicryl in a running fashion. We then closed a second layer of dartos over the tubing. The skin was then closed with 4-0 monocryl in a running fashion. Skin glue was then placed over the incision. We then removed the strings attached to the distal ends of the cylinders and placed skin glue over the incisions in the glans. The device was then cycled to fully erect erect. We then applied gentle traction against the dorsal curvature for 2 minutes. We then inflated the device more and held traction against the curvature for an additional 2 minutes. After the second application of traction the dorsal curvature was less than 30 degrees. We then placed a scrotal fluff and this then concluded the procedure which was well tolerated by the patient.  Complications: None  Condition: Stable, extubated, transferred to PACU.  Plan: Patient is to be admitted overnight for IV antibiotics.  His foley will be removed in the morning and the device will be deactivated. He will followup in 2 weeks for a wound check. He will be discharged with 1 week of antibiotics.

## 2018-04-29 NOTE — Anesthesia Postprocedure Evaluation (Signed)
Anesthesia Post Note  Patient: [name]  Procedure(s) Performed: PENILE PROTHESIS INFLATABLE (N/A Penis)     Patient location during evaluation: PACU Anesthesia Type: General Level of consciousness: awake and alert Pain management: pain level controlled Vital Signs Assessment: post-procedure vital signs reviewed and stable Respiratory status: spontaneous breathing, nonlabored ventilation, respiratory function stable and patient connected to nasal cannula oxygen Cardiovascular status: blood pressure returned to baseline and stable Postop Assessment: no apparent nausea or vomiting Anesthetic complications: no    Last Vitals:  Vitals:   04/29/18 1715 04/29/18 1730  BP: 119/69 140/81  Pulse: 77 77  Resp: 14 11  Temp:  36.5 C  SpO2: 100% 100%    Last Pain:  Vitals:   04/29/18 1730  TempSrc:   PainSc: 7                  Ronika Kelson S
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
## 2018-04-29 NOTE — Progress Notes (Addendum)
Pharmacy Antibiotic Note  [name] is a 62 y.o. male with PMH of ED after radical prostatectomy who has failed medical therapy. Patient underwent placement of inflatable penile prosthesis today, 04/29/2018. Pharmacy has been consulted for Gentamicin dosing post-operatively for surgical prophylaxis. Patient received a dose of Vancomycin 1500mg  IV at 1258 and Gentamicin 5mg /kg IV at 1410 pre-operatively. Serum creatinine is WNL.   Plan: Gentamicin 5mg /kg (340mg ) IV x 1 on 04/30/18 at 1400.  Pharmacy will sign off at this time. Please re-consult if we can be of further assistance.   Height: 5\' 4"  (162.6 cm) Weight: 149 lb 3.2 oz (67.7 kg) IBW/kg (Calculated) : 59.2  Temp (24hrs), Avg:98.1 F (36.7 C), Min:97.5 F (36.4 C), Max:98.6 F (37 C)  Recent Labs  Lab 04/29/18 1211 04/29/18 1704  WBC  --  15.0*  CREATININE 0.90 0.93    Estimated Creatinine Clearance: 70.7 mL/min (by C-G formula based on SCr of 0.93 mg/dL).    Allergies  Allergen Reactions  . Codeine     Upset stomach  . Sulfa Antibiotics Nausea Only      Thank you for allowing pharmacy to be a part of this patient's care.    Lindell Spar, PharmD, BCPS Pager: [phone] 04/29/2018 7:08 PM

## 2018-04-29 NOTE — Progress Notes (Addendum)
Upon entering patient room loose pills discovered on bedside table. Pt reports that pills are stool softeners and must taken every night. After further discussion with patient he revealed that he had a pill bottle concealed in his bed. Unknown pills were removed from patients room and taken to [facility] patient belongings safe. Patient reports that he hasn't taken the medication but is displaying drowsiness, otherwise alert and oriented. Vital signs are stable and patient is now resting comfortably but easily awakened. Patient education provided on importance of only taken medication given by nurse that was ordered by physician. Patient verbalized understanding of patient education. MD notified, no new orders obtained. Will continue to monitor patient.   Lenna Sciara, RN

## 2018-04-29 NOTE — Transfer of Care (Signed)
Immediate Anesthesia Transfer of Care Note  Patient: [name]  Procedure(s) Performed: PENILE PROTHESIS INFLATABLE (N/A Penis)  Patient Location: PACU  Anesthesia Type:General  Level of Consciousness: drowsy  Airway & Oxygen Therapy: Patient Spontanous Breathing and Patient connected to nasal cannula oxygen  Post-op Assessment: Report given to RN  Post vital signs: Reviewed and stable  Last Vitals:  Vitals Value Taken Time  BP 136/80 04/29/2018  4:30 PM  Temp    Pulse 80 04/29/2018  4:32 PM  Resp 14 04/29/2018  4:32 PM  SpO2 100 % 04/29/2018  4:32 PM  Vitals shown include unvalidated device data.  Last Pain:  Vitals:   04/29/18 1140  TempSrc:   PainSc: 0-No pain      Patients Stated Pain Goal: 4 (92/49/32 4199)  Complications: No apparent anesthesia complications

## 2018-04-29 NOTE — Anesthesia Preprocedure Evaluation (Addendum)
Anesthesia Evaluation  Patient identified by MRN, date of birth, ID band Patient awake    Reviewed: Allergy & Precautions, NPO status , Patient's Chart, lab work & pertinent test results  Airway Mallampati: II  TM Distance: >3 FB Neck ROM: Full    Dental no notable dental hx.    Pulmonary Current Smoker,    Pulmonary exam normal breath sounds clear to auscultation       Cardiovascular hypertension, Pt. on medications Normal cardiovascular exam Rhythm:Regular Rate:Normal  ECG: rate 73 Normal sinus rhythm Possible Left atrial enlargement Left axis deviation   Neuro/Psych PSYCHIATRIC DISORDERS Anxiety Head injury in 1979    GI/Hepatic hiatal hernia, (+)     substance abuse  ,   Endo/Other  negative endocrine ROS  Renal/GU negative Renal ROS     Musculoskeletal Chronic low back pain   Abdominal   Peds  Hematology HLD   Anesthesia Other Findings ERECTILE DYSFUNCTION PEYRONIES DISEASE  Reproductive/Obstetrics                            Anesthesia Physical Anesthesia Plan  ASA: III  Anesthesia Plan: General   Post-op Pain Management:    Induction: Intravenous  PONV Risk Score and Plan: 1 and Dexamethasone, Ondansetron and Treatment may vary due to age or medical condition  Airway Management Planned: LMA  Additional Equipment:   Intra-op Plan:   Post-operative Plan: Extubation in OR  Informed Consent: I have reviewed the patients History and Physical, chart, labs and discussed the procedure including the risks, benefits and alternatives for the proposed anesthesia with the patient or authorized representative who has indicated his/her understanding and acceptance.   Dental advisory given  Plan Discussed with: CRNA  Anesthesia Plan Comments:        Anesthesia Quick Evaluation

## 2018-04-29 NOTE — Anesthesia Procedure Notes (Signed)
Procedure Name: LMA Insertion Date/Time: 04/29/2018 2:12 PM Performed by: Bonney Aid, CRNA Pre-anesthesia Checklist: Patient identified, Emergency Drugs available, Suction available and Patient being monitored Patient Re-evaluated:Patient Re-evaluated prior to induction Oxygen Delivery Method: Circle system utilized Preoxygenation: Pre-oxygenation with 100% oxygen Induction Type: IV induction Ventilation: Mask ventilation without difficulty LMA: LMA inserted LMA Size: 4.0 Number of attempts: 1 Airway Equipment and Method: Bite block Placement Confirmation: positive ETCO2 Tube secured with: Tape Dental Injury: Teeth and Oropharynx as per pre-operative assessment

## 2018-04-30 ENCOUNTER — Encounter (HOSPITAL_BASED_OUTPATIENT_CLINIC_OR_DEPARTMENT_OTHER): Payer: Self-pay | Admitting: Urology

## 2018-04-30 DIAGNOSIS — N486 Induration penis plastica: Secondary | ICD-10-CM | POA: Diagnosis not present

## 2018-04-30 LAB — BASIC METABOLIC PANEL
ANION GAP: 7 (ref 5–15)
BUN: 12 mg/dL (ref 6–20)
CO2: 25 mmol/L (ref 22–32)
Calcium: 8.9 mg/dL (ref 8.9–10.3)
Chloride: 111 mmol/L (ref 98–111)
Creatinine, Ser: 0.79 mg/dL (ref 0.61–1.24)
GFR calc Af Amer: >60 mL/min (ref >60)
Glucose, Bld: 153 mg/dL — ABNORMAL HIGH (ref 70–99)
Potassium: 4.2 mmol/L (ref 3.5–5.1)
SODIUM: 143 mmol/L (ref 135–145)

## 2018-04-30 LAB — CBC
HCT: 38.9 % — ABNORMAL LOW (ref 39.0–52.0)
Hemoglobin: 12.5 g/dL — ABNORMAL LOW (ref 13.0–17.0)
MCH: 30.6 pg (ref 26.0–34.0)
MCHC: 32.1 g/dL (ref 30.0–36.0)
MCV: 95.1 fL (ref 80.0–100.0)
NRBC: 0 % (ref 0.0–0.2)
PLATELETS: 227 10*3/uL (ref 150–400)
RBC: 4.09 MIL/uL — AB (ref 4.22–5.81)
RDW: 13.6 % (ref 11.5–15.5)
WBC: 11.6 10*3/uL — ABNORMAL HIGH (ref 4.0–10.5)

## 2018-04-30 MED ORDER — VANCOMYCIN HCL IN DEXTROSE 1-5 GM/200ML-% IV SOLN
INTRAVENOUS | Status: AC
  Filled 2018-04-30: qty 200

## 2018-04-30 MED ORDER — BISACODYL 10 MG RE SUPP
10.0000 mg | Freq: Every day | RECTAL | Status: DC | PRN
  Administered 2018-04-30: 10 mg via RECTAL
  Filled 2018-04-30 (×2): qty 1

## 2018-04-30 MED ORDER — OXYCODONE-ACETAMINOPHEN 5-325 MG PO TABS: 1.0000 | ORAL_TABLET | ORAL | 0 refills | Status: DC | PRN

## 2018-04-30 MED ORDER — SULFAMETHOXAZOLE-TRIMETHOPRIM 800-160 MG PO TABS: 1.0000 | ORAL_TABLET | Freq: Two times a day (BID) | ORAL | 0 refills | Status: DC

## 2018-04-30 MED ORDER — OXYCODONE-ACETAMINOPHEN 5-325 MG PO TABS
ORAL_TABLET | ORAL | Status: AC
  Filled 2018-04-30: qty 2

## 2018-04-30 MED FILL — OXYCODONE-ACETAMINOPHEN 5-3: 5-325 | 5 days supply | Qty: 30 | Fill #0

## 2018-04-30 MED FILL — SULFAMETHOXAZOLE-TMP DS TAB: 800-160 | 7 days supply | Qty: 14 | Fill #0

## 2018-04-30 NOTE — Progress Notes (Signed)
04/30/2018  0930 Clarified with Dr. Alyson Ingles that he did want patient to have RX for Percocet despite having RX at home for Vicodin. MD aware of pt. Attempting to hide and take home medications last pm. Verbal order ok to give pt. New RX for Percocet. 1040 Pt. Mother at bedside when discharge instructions were reviewed. D/c avs form, medications already taken today and those due this evening given and explained to patient and mother. Follow up appointments and when to call MD reviewed. Incision site care reviewed. RX reviewed. Safe narcotic use reviewed. Emphasis placed on the importance of not taking pt. Home med Hydrocodone at the same time as new RX for Percocet. Pt. Voiced understanding as well as mother. Emphasis placed on having someone with the patient with the first few days due to intermittent confusion last night and while taking narcotic pain medication. D/c iv. D/c home per orders.  Idella Lamontagne, Arville Lime

## 2018-04-30 NOTE — Discharge Instructions (Signed)
Penile Prosthesis Implantation Penile prosthesis implantation is a procedure to put a device that treats erectile dysfunction into the penis. There are two main types of devices that can be put in during the procedure: malleable penile implants and inflatable penile implants. Malleable penile implant A malleable penile implant, also called a non-hydraulic or semi-rigid implant, consists of two silicone rubber rods. The rods provide some rigidity. They are also flexible, so the penis can both curve downward in its normal position and become straight for sexual intercourse. Inflatable penile implant  An inflatable penile implant, also called a hydraulic implant, consists of cylinders, a pump, and a reservoir. The cylinders can be inflated with a fluid that helps to create an erection, and they can be deflated after intercourse. There are several types of inflatable implants. Tell a health care provider about:  Any allergies you have.  All medicines you are taking, including vitamins, herbs, eye drops, creams, and over-the-counter medicines.  Any problems you or family members have had with anesthetic medicines.  Any blood disorders you have.  Any surgeries you have had.  Any medical conditions you have. What are the risks? Generally, this is a safe procedure. However, problems may occur, including:  Infection in the penis. If this happens, the implant may need to be removed.  Bleeding.  Allergic reaction to medicines.  Damage to other structures or organs, such as the tube that drains urine from the body (urethra).  Not enough blood reaching the penis. This is rare. If this happens, the implant will need to be removed.  What happens before the procedure? Medicines  Ask your health care provider about: ? Changing or stopping your regular medicines. This is especially important if you are taking diabetes medicines or blood thinners. ? Taking medicines such as aspirin and ibuprofen.  These medicines can thin your blood. Do not take these medicines before your procedure if your health care provider instructs you not to. Staying hydrated Follow instructions from your health care provider about hydration, which may include:  Up to 2 hours before the procedure - you may continue to drink clear liquids, such as water, clear fruit juice, black coffee, and plain tea.  Eating and drinking restrictions Follow instructions from your health care provider about eating and drinking, which may include:  8 hours before the procedure - stop eating heavy meals or foods such as meat, fried foods, or fatty foods.  6 hours before the procedure - stop eating light meals or foods, such as toast or cereal.  6 hours before the procedure - stop drinking milk or drinks that contain milk.  2 hours before the procedure - stop drinking clear liquids.  General instructions  You may be asked to shower with a germ-killing soap.  Plan to have someone take you home from the hospital or clinic.  If you will be going home right after the procedure, plan to have someone with you for 24 hours. What happens during the procedure?  To lower your risk of infection: ? Your health care team will wash or sanitize their hands. ? Hair may be removed from the surgical area. ? Your skin will be washed with soap. ? You may be given antibiotic medicine.  An IV tube will be inserted into one of your veins.  You will be given one or more of the following: ? A medicine to make you fall asleep (general anesthetic). ? A medicine that is injected into your spine to numb the area below  and slightly above the injection site (spinal anesthetic).  A flexible tube (catheter) may be inserted into your urethra and bladder. The catheter drains urine during the procedure and helps your surgeon easily locate your urethra.  A small incision will be made in your scrotum or in your penis, just below the head of your  penis.  The cylinders of the prosthesis will be put into tissue on each side of your penis.  If you will have an inflatable penile implant: ? Incisions will be made in your abdomen and in your scrotum. These incisions will be used to insert the pump and the reservoir. ? The cylinders, reservoir, and pump will be joined by tubes and tested.  Your incision(s) will be closed with dissolvable stitches (sutures).  A bandage (dressing) will be applied to your incision(s).  You may be fitted with a device similar to a jock strap or underwear with a supportive pouch (scrotal support) to relieve pressure on the incision area. The procedure may vary among health care providers and hospitals. What happens after the procedure?  Your blood pressure, heart rate, breathing rate, and blood oxygen level will be monitored until the medicines you were given have worn off.  If you have a catheter in place, it may stay in place for the day after the procedure.  You may be given antibiotics or pain medicines as needed.  You may need to follow a clear liquid diet for the first 24 hours after the procedure.  You may be encouraged to sit up and walk around.  A towel roll or an ice pack may be placed under your scrotum to help reduce swelling.  Do not drive for 24 hours if you were given a sedative. Summary  Penile prosthesis implantation is a procedure to put a device that treats erectile dysfunction into the penis.  There are two main types of devices that can be put in during the procedure: malleable penile implants and inflatable penile implants.  After the procedure, you may be fitted with a device similar to a jock strap or underwear with a supportive pouch (scrotal support) to relieve pressure on the incision area. This information is not intended to replace advice given to you by your health care provider. Make sure you discuss any questions you have with your health care provider. Document  Released: 10/10/2007 Document Revised: 04/14/2016 Document Reviewed: 04/14/2016 Elsevier Interactive Patient Education  [employer].

## 2018-05-03 NOTE — Discharge Summary (Signed)
Physician Discharge Summary  Patient ID: [name] MRN: [id number] DOB/AGE: July 14, 1956 62 y.o.  Admit date: 04/29/2018 Discharge date: 04/30/2018  Admission Diagnoses: Erectile dysfunction Discharge Diagnoses:  Active Problems:   Erectile dysfunction   Discharged Condition: good  Hospital Course: The patient tolerated the procedure well and was transferred to the floor on IV pain meds, IV fluid. On POD#1 foley was removed, pt was started on regular diet, he device was deflated and they ambulated in the halls.  Prior to discharge the pt was tolerating a regular diet, pain was controlled on PO pain meds, they were ambulating without difficulty, and they had normal bowel function.   Consults: None  Significant Diagnostic Studies: none  Treatments: surgery: IPP placement  Discharge Exam: Blood pressure 125/67, pulse 86, temperature 97.7 F (36.5 C), resp. rate 16, height 5\' 4"  (1.626 m), weight 67.7 kg, SpO2 98 %. General appearance: alert, cooperative and appears stated age Head: Normocephalic, without obvious abnormality, atraumatic Nose: Nares normal. Septum midline. Mucosa normal. No drainage or sinus tenderness. Neck: no adenopathy, no carotid bruit, no JVD, supple, symmetrical, trachea midline and thyroid not enlarged, symmetric, no tenderness/mass/nodules Resp: clear to auscultation bilaterally Cardio: regular rate and rhythm, S1, S2 normal, no murmur, click, rub or gallop GI: soft, non-tender; bowel sounds normal; no masses,  no organomegaly Extremities: extremities normal, atraumatic, no cyanosis or edema Neurologic: Grossly normal  Disposition: Discharge disposition: 01-Home or Self Care       Discharge Instructions    Discharge patient   Complete by:  As directed    Discharge disposition:  01-Home or Self Care   Discharge patient date:  04/30/2018     Allergies as of 04/30/2018      Reactions   Codeine    Upset stomach   Sulfa Antibiotics Nausea  Only      Medication List    TAKE these medications   amLODipine 10 MG tablet Commonly known as:  NORVASC Take 10 mg by mouth every morning.   cloNIDine 0.1 MG tablet Commonly known as:  CATAPRES Take 0.1 mg by mouth every morning.   diazepam 10 MG tablet Commonly known as:  VALIUM Take 5 mg by mouth as needed for anxiety.   diphenhydramine-acetaminophen 25-500 MG Tabs tablet Commonly known as:  TYLENOL PM Take 1 tablet by mouth at bedtime as needed.   HYDROcodone-acetaminophen 10-325 MG tablet Commonly known as:  NORCO Take 2 tablets by mouth every 6 (six) hours as needed.   hydrOXYzine 25 MG capsule Commonly known as:  VISTARIL Take 25 mg by mouth 2 (two) times daily as needed for anxiety.   Lecithin 1200 MG Caps Take by mouth.   multivitamin tablet Take 1 tablet by mouth daily.   OMEGA-3 FISH OIL PO Take by mouth.   oxyCODONE-acetaminophen 5-325 MG tablet Commonly known as:  PERCOCET/ROXICET Take 1 tablet by mouth every 4 (four) hours as needed for moderate pain.   STOOL SOFTENER 100 MG capsule Generic drug:  docusate sodium Take 100 mg by mouth 2 (two) times daily.   sulfamethoxazole-trimethoprim 800-160 MG tablet Commonly known as:  BACTRIM DS,SEPTRA DS Take 1 tablet by mouth 2 (two) times daily.   TURMERIC PO Take by mouth.   vitamin C 1000 MG tablet Take 1,000 mg by mouth daily.      Follow-up Information    Karen Kays, NP. Call in 2 week(s).   Specialty:  Nurse Practitioner Contact information: [address]  [place]           Signed: Nicolette Bang 05/03/2018, 10:07 PM

## 2019-02-18 DIAGNOSIS — I10 Essential (primary) hypertension: Secondary | ICD-10-CM | POA: Insufficient documentation

## 2019-07-02 DIAGNOSIS — M25519 Pain in unspecified shoulder: Secondary | ICD-10-CM | POA: Insufficient documentation

## 2019-09-18 ENCOUNTER — Ambulatory Visit: Payer: BLUE CROSS/BLUE SHIELD

## 2019-09-21 ENCOUNTER — Ambulatory Visit: Payer: BLUE CROSS/BLUE SHIELD | Attending: Internal Medicine

## 2019-09-21 DIAGNOSIS — Z23 Encounter for immunization: Secondary | ICD-10-CM

## 2019-09-21 NOTE — Progress Notes (Signed)
   Covid-19 Vaccination Clinic  Name:  [name]    MRN: [id number] DOB: 07/05/1956  09/21/2019  Mr. Levasseur was observed post Covid-19 immunization for 15 minutes without incident. He was provided with Vaccine Information Sheet and instruction to access the V-Safe system.   Mr. Blass was instructed to call 911 with any severe reactions post vaccine: Marland Kitchen Difficulty breathing  . Swelling of face and throat  . A fast heartbeat  . A bad rash all over body  . Dizziness and weakness   Immunizations Administered    Name Date Dose VIS Date Route   Pfizer COVID-19 Vaccine 09/21/2019 12:35 PM 0.3 mL 06/27/2019 Intramuscular   Manufacturer: [place]   Lot: [id number]   [place]: [id number]

## 2019-10-16 ENCOUNTER — Ambulatory Visit: Payer: BLUE CROSS/BLUE SHIELD | Attending: Internal Medicine

## 2019-10-16 DIAGNOSIS — Z23 Encounter for immunization: Secondary | ICD-10-CM

## 2019-10-16 NOTE — Progress Notes (Signed)
   Covid-19 Vaccination Clinic  Name:  [name]    MRN: [id number] DOB: Jul 05, 1956  10/16/2019  Mr. [name] was observed post Covid-19 immunization for 15 minutes without incident. He was provided with Vaccine Information Sheet and instruction to access the V-Safe system.   Mr. [name] was instructed to call 911 with any severe reactions post vaccine: Marland Kitchen Difficulty breathing  . Swelling of face and throat  . A fast heartbeat  . A bad rash all over body  . Dizziness and weakness   Immunizations Administered    Name Date Dose VIS Date Route   Pfizer COVID-19 Vaccine 10/16/2019  4:17 PM 0.3 mL 06/27/2019 Intramuscular   Manufacturer: [place]   Lot: [id number]   [place]: [id number]

## 2019-10-20 ENCOUNTER — Ambulatory Visit: Payer: BLUE CROSS/BLUE SHIELD

## 2019-10-21 ENCOUNTER — Ambulatory Visit: Payer: BLUE CROSS/BLUE SHIELD

## 2021-02-25 ENCOUNTER — Other Ambulatory Visit: Payer: Self-pay

## 2021-02-25 DIAGNOSIS — I872 Venous insufficiency (chronic) (peripheral): Secondary | ICD-10-CM

## 2021-03-10 ENCOUNTER — Encounter (HOSPITAL_COMMUNITY): Payer: BLUE CROSS/BLUE SHIELD

## 2021-09-01 ENCOUNTER — Other Ambulatory Visit: Payer: Self-pay | Admitting: Rehabilitation

## 2021-09-01 DIAGNOSIS — M48062 Spinal stenosis, lumbar region with neurogenic claudication: Secondary | ICD-10-CM

## 2021-09-14 ENCOUNTER — Other Ambulatory Visit: Payer: Self-pay | Admitting: Family Medicine

## 2021-09-14 DIAGNOSIS — M659 Synovitis and tenosynovitis, unspecified: Secondary | ICD-10-CM

## 2021-09-14 DIAGNOSIS — M2391 Unspecified internal derangement of right knee: Secondary | ICD-10-CM

## 2021-09-14 DIAGNOSIS — G8929 Other chronic pain: Secondary | ICD-10-CM

## 2021-09-15 ENCOUNTER — Ambulatory Visit: Payer: Self-pay | Admitting: General Surgery

## 2021-09-15 DIAGNOSIS — Z8719 Personal history of other diseases of the digestive system: Secondary | ICD-10-CM

## 2021-09-26 ENCOUNTER — Ambulatory Visit
Admission: RE | Admit: 2021-09-26 | Discharge: 2021-09-26 | Disposition: A | Discharge status: Home / Self Care | Payer: Managed Care, Other (non HMO) | Source: Ambulatory Visit | Attending: Rehabilitation | Admitting: Rehabilitation

## 2021-09-26 DIAGNOSIS — M48062 Spinal stenosis, lumbar region with neurogenic claudication: Secondary | ICD-10-CM

## 2021-09-26 IMAGING — MR MR LUMBAR SPINE W/O CM
4 of 5 series · 24 of 48 positions shown · non-contrast
Comparison: Report of MRI lumbar spine [DATE] at [REDACTED]

CLINICAL DATA: Low back pain with progression, right greater than
left. Personal history of prostate cancer.

EXAM:
MRI LUMBAR SPINE WITHOUT CONTRAST
TECHNIQUE: Multiplanar, multisequence MR imaging of the lumbar spine was
performed. No intravenous contrast was administered.

[Series 3: T2 · sagittal · 4.0mm · 0.53mm/px · 7 of 15 slices shown (1 of 2)]
[im 1/15]
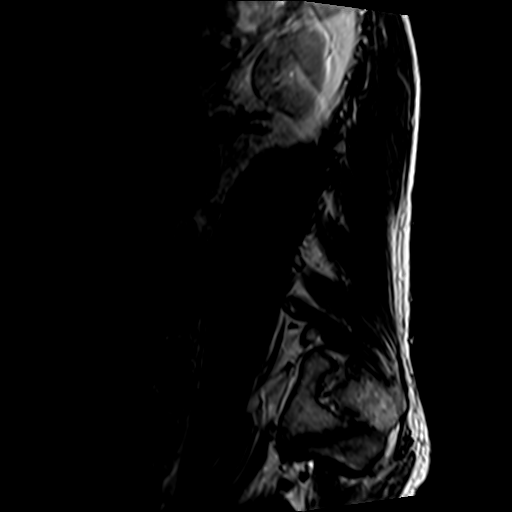
[im 3/15]
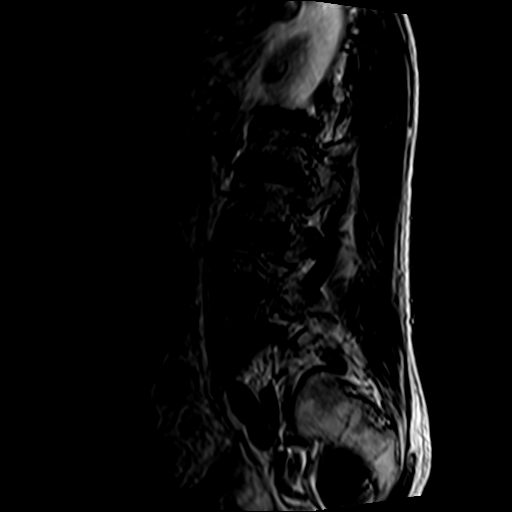
[im 5/15]
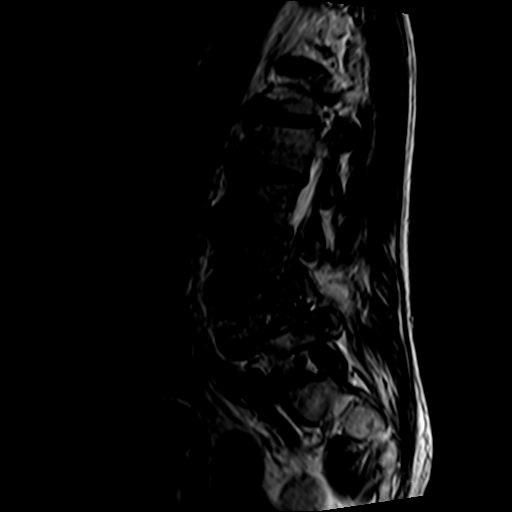
[im 8/15]
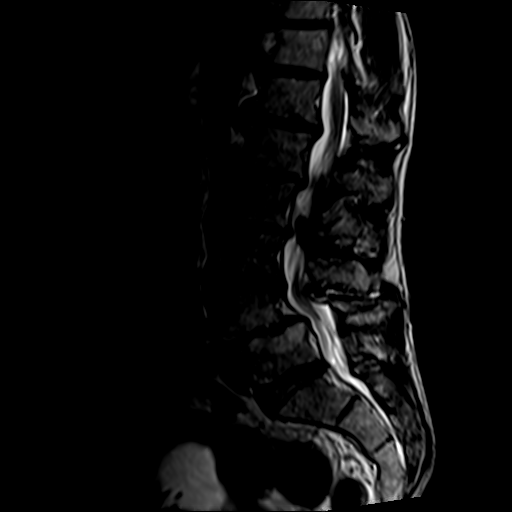
[im 10/15]
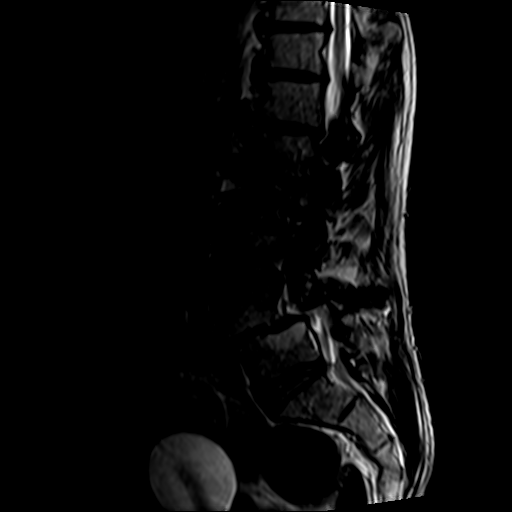
[im 12/15]
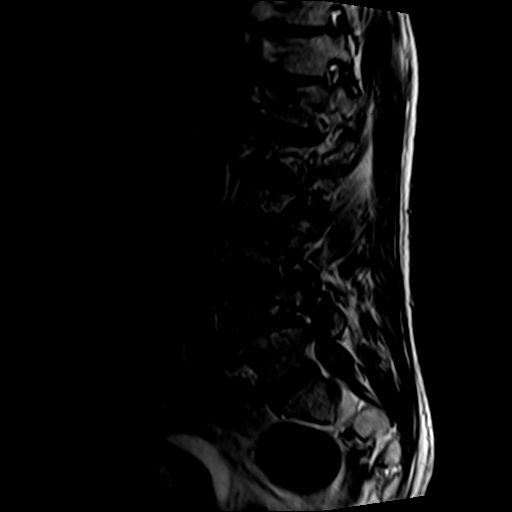
[im 15/15]
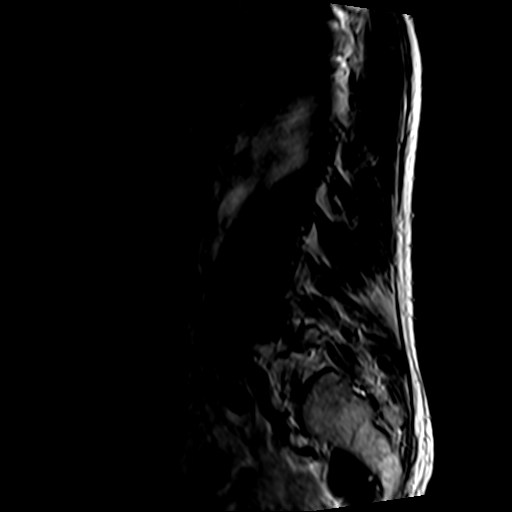

[Series 5: T1 · sagittal · 4.0mm · 0.53mm/px · 6 of 15 slices shown (1 of 2)]
[im 1/15]
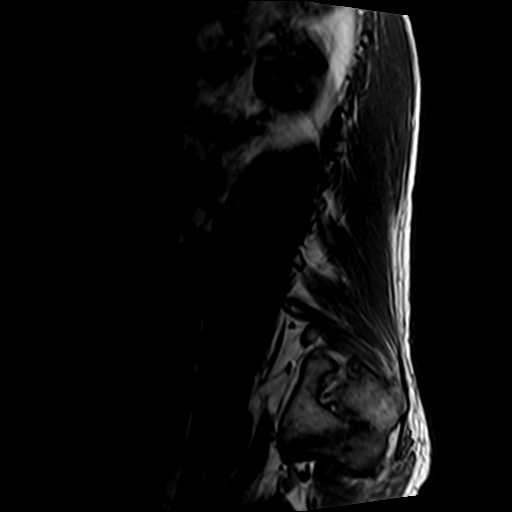
[im 3/15]
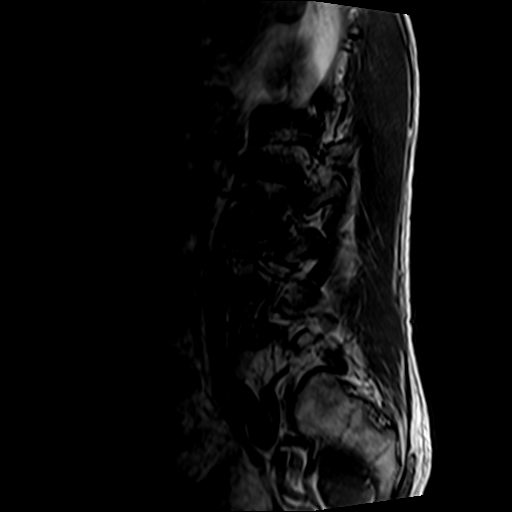
[im 6/15]
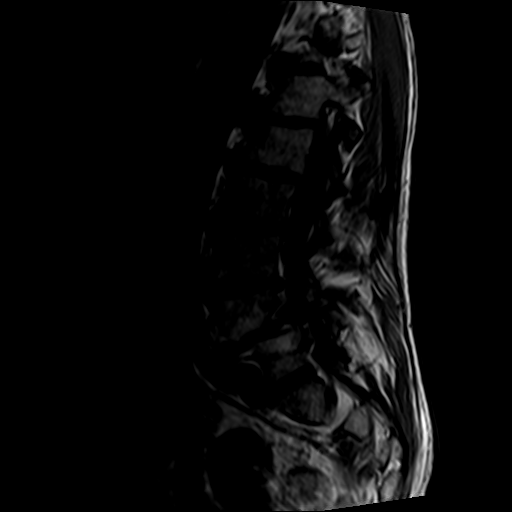
[im 9/15]
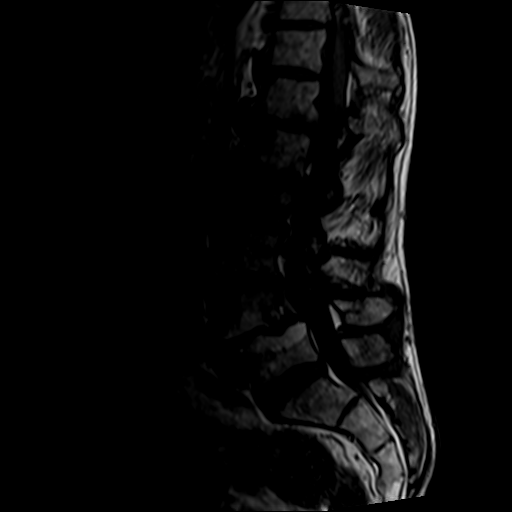
[im 12/15]
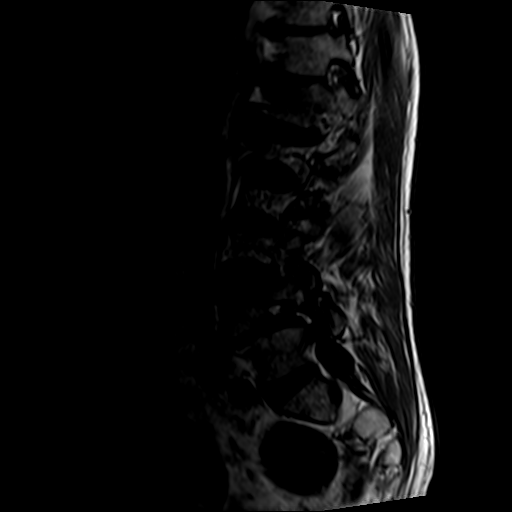
[im 15/15]
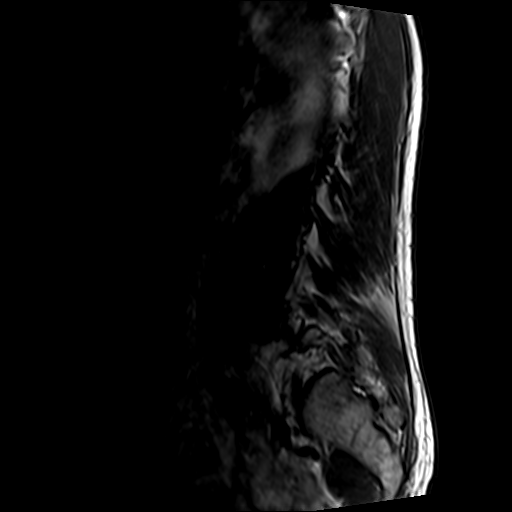

[Series 6: T2 · axial · 4.0mm · 0.70mm/px · z∈[-31,+161]mm · 8 of 33 slices shown (2 of 2)]
[im 1/33]
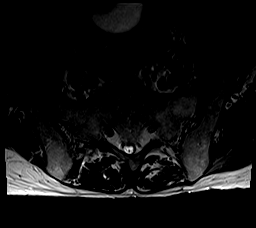
[im 5/33]
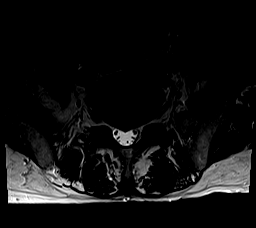
[im 10/33]
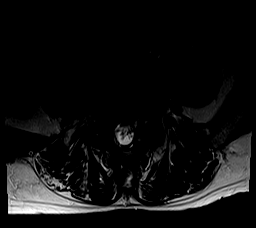
[im 15/33]
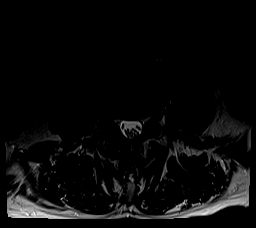
[im 18/33]
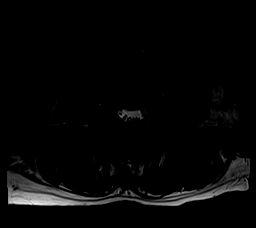
[im 23/33]
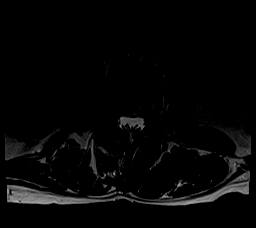
[im 28/33]
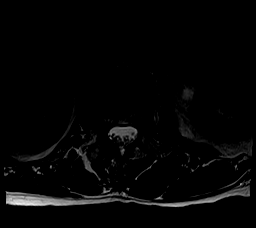
[im 33/33]
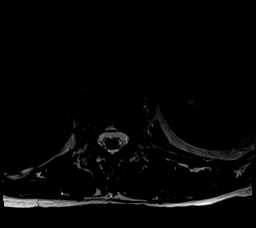

[Series 7: T1 · axial · 4.0mm · 0.35mm/px · z∈[-11,+135]mm · 3 of 33 slices shown (2 of 2)]
[im 5/33]
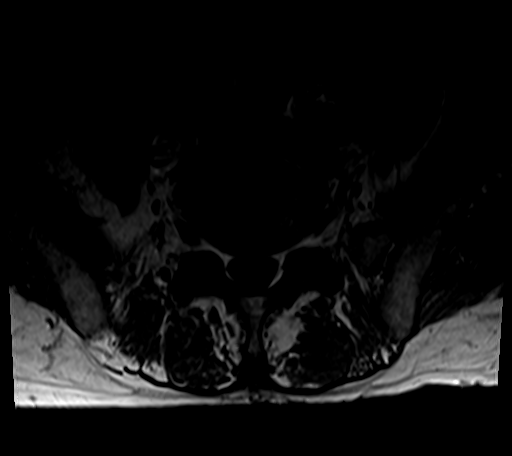
[im 18/33]
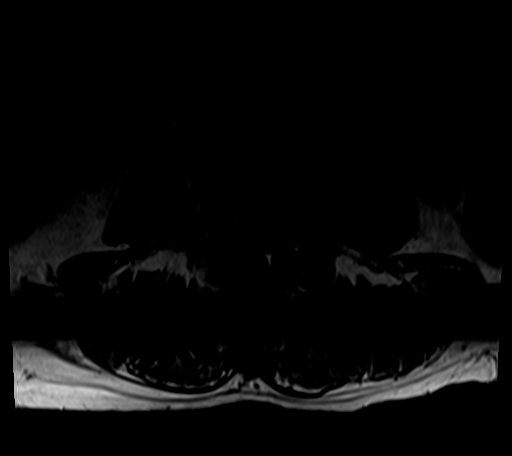
[im 28/33]
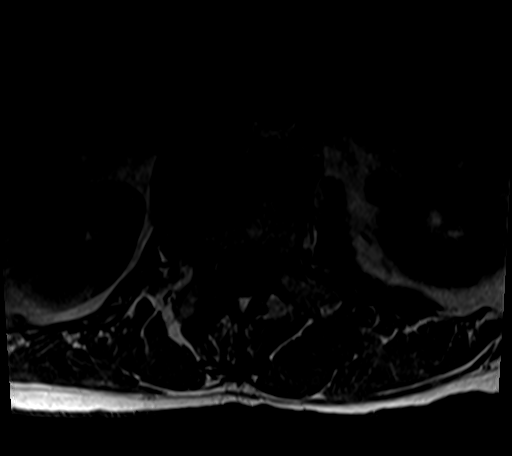

[24 of 48 positions shown; findings below may reference images not displayed]

FINDINGS: Segmentation: 5 non rib-bearing lumbar type vertebral bodies are
present. The lowest fully formed vertebral body is L5.

Alignment: Grade 2 anterolisthesis at L4-5 measures 11 mm. Slight
retrolisthesis is present at L1-2 and L2-3. Levoconvex curvature is
centered at L2. Rightward curvature is centered at L5.

Vertebrae: Mixed fatty and edematous changes are present at L4-5.
Marrow signal and vertebral body heights are otherwise normal.
Inferior endplate Schmorl's node is present at T12.

Conus medullaris and cauda equina: Conus extends to the T12-L1
level. Conus and cauda equina appear normal.

Paraspinal and other soft tissues: Limited imaging the abdomen is
unremarkable. There is no significant adenopathy. No solid organ
lesions are present.

Disc levels:

T12-L1: A rightward disc protrusion is present. Asymmetric facet
hypertrophy results in moderate right foraminal narrowing. The left
foramen is patent.

L1-2: A broad-based disc protrusion is asymmetric to the right.
Asymmetric right-sided facet hypertrophy is present. Mild right
subarticular and moderate right foraminal narrowing is present. The
left foramen is patent.

L2-3: A broad-based disc protrusion is present. Moderate facet
hypertrophy is noted bilaterally. Mild subarticular narrowing is
present bilaterally. Moderate foraminal narrowing is worse right
than left.

L3-4: Mild broad-based disc protrusion is present. Moderate facet
hypertrophy is noted bilaterally. Mild subarticular and moderate
foraminal narrowing is present bilaterally.

L4-5: Bilateral pars defects are present. Uncovering of a
broad-based disc protrusion noted. Mild central canal stenosis is
present. Moderate foraminal narrowing is worse left than right.

L5-S1: A shallow central disc protrusion is present. Mild facet
hypertrophy is worse on left. No significant stenosis is present.
IMPRESSION: 1. Bilateral pars defects with grade 2 anterolisthesis at L4-5.
2. Mild central canal stenosis is present with moderate foraminal
narrowing bilaterally at L4-5, left greater than right.
3. Mild subarticular and moderate foraminal stenosis bilaterally at
L3-4.
4. Mild right subarticular and moderate right foraminal stenosis at
L1-2.
5. Moderate right foraminal stenosis at T12-L1.
6. Shallow central disc protrusion with mild facet hypertrophy at
L5-S1 without significant stenosis.

## 2021-09-26 NOTE — Progress Notes (Signed)
Anesthesia Review:  PCP: Cardiologist : Chest x-ray : EKG : Echo : Stress test: Cardiac Cath :  Activity level:  Sleep Study/ CPAP : Fasting Blood Sugar :      / Checks Blood Sugar -- times a day:   Blood Thinner/ Instructions /Last Dose: ASA / Instructions/ Last Dose :

## 2021-09-26 NOTE — Progress Notes (Signed)
DUE TO COVID-19 ONLY ONE VISITOR IS ALLOWED TO COME WITH YOU AND STAY IN THE WAITING ROOM ONLY DURING PRE OP AND PROCEDURE DAY OF SURGERY.  2 VISITOR  MAY VISIT WITH YOU AFTER SURGERY IN YOUR PRIVATE ROOM DURING VISITING HOURS ONLY! ?YOU MAY HAVE ONE PERSON SPEND THE NITE WITH YOU IN YOUR ROOM AFTER SURGERY.   ? ? ? ? Your proc  edure is scheduled on:  ?                    10/04/21.  ? Report to [facility] Main  Entrance ? ? Report to admitting at  0515              AM ?DO NOT [place], PICTURE ID OR WALLET DAY OF SURGERY.  ?  ? ? Call this number if you have problems the morning of surgery [phone]  ? ? REMEMBER: NO  SOLID FOODS , CANDY, GUM OR MINTS AFTER MIDNITE THE NITE BEFORE SURGERY .       Marland Kitchen CLEAR LIQUIDS UNTIL      0430am           DAY OF SURGERY.      PLEASE FINISH ENSURE DRINK PER SURGEON ORDER  WHICH NEEDS TO BE COMPLETED AT     0430am     MORNING OF SURGERY.   ? ? ? ? ?CLEAR LIQUID DIET ? ? ?Foods Allowed      ?WATER ?BLACK COFFEE ( SUGAR OK, NO MILK, CREAM OR CREAMER) REGULAR AND DECAF  ?TEA ( SUGAR OK NO MILK, CREAM, OR CREAMER) REGULAR AND DECAF  ?PLAIN JELLO ( NO RED)  ?FRUIT ICES ( NO RED, NO FRUIT PULP)  ?POPSICLES ( NO RED)  ?JUICE- APPLE, WHITE GRAPE AND WHITE CRANBERRY  ?SPORT DRINK LIKE GATORADE ( NO RED)  ?CLEAR BROTH ( VEGETABLE , CHICKEN OR BEEF)                                                               ? ?    ? ?BRUSH YOUR TEETH MORNING OF SURGERY AND RINSE YOUR MOUTH OUT, NO CHEWING GUM CANDY OR MINTS. ?  ? ? Take these medicines the morning of surgery with A SIP OF WATER:  amlodipine, clonidine  ? ? ?DO NOT TAKE ANY DIABETIC MEDICATIONS DAY OF YOUR SURGERY ?                  ?            You may not have any metal on your body including hair pins and  ?            piercings  Do not wear jewelry, make-up, lotions, powders or perfumes, deodorant ?            Do not wear nail polish on your fingernails.   ?           IF YOU ARE A MALE AND WANT TO SHAVE UNDER  ARMS OR LEGS PRIOR TO SURGERY YOU MUST DO SO AT LEAST 48 HOURS PRIOR TO SURGERY.  ?            Men may shave face and neck. ? ? Do not bring valuables to the hospital.  [facility] IS NOT ?            RESPONSIBLE   FOR VALUABLES. ? Contacts, dentures or bridgework may not be worn into surgery. ? Leave suitcase in the car. After surgery it may be brought to your room. ? ?  ? Patients discharged the day of surgery will not be allowed to drive home. IF YOU ARE HAVING SURGERY AND GOING HOME THE SAME DAY, YOU MUST HAVE AN ADULT TO DRIVE YOU HOME AND BE WITH YOU FOR 24 HOURS. YOU MAY GO HOME BY TAXI OR UBER OR ORTHERWISE, BUT AN ADULT MUST ACCOMPANY YOU HOME AND STAY WITH YOU FOR 24 HOURS. ?  ? ?            Please read over the following fact sheets you were given: ?_____________________________________________________________________ ? ?[place] - Preparing for Surgery ?Before surgery, you can play an important role.  Because skin is not sterile, your skin needs to be as free of germs as possible.  You can reduce the number of germs on your skin by washing with CHG (chlorahexidine gluconate) soap before surgery.  CHG is an antiseptic cleaner which kills germs and bonds with the skin to continue killing germs even after washing. ?Please DO NOT use if you have an allergy to CHG or antibacterial soaps.  If your skin becomes reddened/irritated stop using the CHG and inform your nurse when you arrive at Short Stay. ?Do not shave (including legs and underarms) for at least 48 hours prior to the first CHG shower.  You may shave your face/neck. ?Please follow these instructions carefully: ? 1.  Shower with CHG Soap the night before surgery and the  morning of Surgery. ? 2.  If you choose to wash your hair, wash your hair first as usual with your  normal  shampoo. ? 3.  After you shampoo, rinse your hair and body thoroughly to remove the  shampoo.                           4.  Use CHG as you would any other liquid soap.  You  can apply chg directly  to the skin and wash  ?                     Gently with a scrungie or clean washcloth. ? 5.  Apply the CHG Soap to your body ONLY FROM THE NECK DOWN.   Do not use on face/ open      ?                     Wound or open sores. Avoid contact with eyes, ears mouth and genitals (private parts).  ?                     Production manager,  Genitals (private parts) with your normal soap. ?            6.  Wash thoroughly, paying special attention to the area where your surgery  will be performed. ? 7.  Thoroughly rinse your body with warm water from the neck down. ? 8.  DO NOT shower/wash with your normal soap after using and rinsing off  the CHG Soap. ?               9.  Pat yourself dry with a clean towel. ?  10.  Wear clean pajamas. ?           11.  Place clean sheets on your bed the night of your first shower and do not  sleep with pets. ?Day of Surgery : ?Do not apply any lotions/deodorants the morning of surgery.  Please wear clean clothes to the hospital/surgery center. ? ?FAILURE TO FOLLOW THESE INSTRUCTIONS MAY RESULT IN THE CANCELLATION OF YOUR SURGERY ?PATIENT SIGNATURE_________________________________ ? ?NURSE SIGNATURE__________________________________ ? ?________________________________________________________________________  ? ? ?           ?

## 2021-09-28 ENCOUNTER — Other Ambulatory Visit: Payer: Self-pay

## 2021-09-28 ENCOUNTER — Encounter (HOSPITAL_COMMUNITY)
Admission: RE | Admit: 2021-09-28 | Discharge: 2021-09-28 | Disposition: A | Discharge status: Home / Self Care | Payer: Managed Care, Other (non HMO) | Source: Ambulatory Visit | Attending: General Surgery | Admitting: General Surgery

## 2021-09-28 ENCOUNTER — Encounter (HOSPITAL_COMMUNITY): Payer: Self-pay

## 2021-09-28 ENCOUNTER — Other Ambulatory Visit: Payer: BLUE CROSS/BLUE SHIELD

## 2021-09-28 ENCOUNTER — Encounter (HOSPITAL_COMMUNITY): Payer: Self-pay | Admitting: General Surgery

## 2021-09-28 DIAGNOSIS — Z01818 Encounter for other preprocedural examination: Secondary | ICD-10-CM | POA: Insufficient documentation

## 2021-09-28 DIAGNOSIS — Z8719 Personal history of other diseases of the digestive system: Secondary | ICD-10-CM | POA: Diagnosis not present

## 2021-09-28 LAB — CBC WITH DIFFERENTIAL/PLATELET
Abs Immature Granulocytes: 0.02 10*3/uL (ref 0.00–0.07)
Basophils Absolute: 0 10*3/uL (ref 0.0–0.1)
Basophils Relative: 1 %
Eosinophils Absolute: 0.4 10*3/uL (ref 0.0–0.5)
Eosinophils Relative: 6 %
HCT: 43.1 % (ref 39.0–52.0)
Hemoglobin: 13.7 g/dL (ref 13.0–17.0)
Immature Granulocytes: 0 %
Lymphocytes Relative: 18 %
Lymphs Abs: 1.2 10*3/uL (ref 0.7–4.0)
MCH: 30.7 pg (ref 26.0–34.0)
MCHC: 31.8 g/dL (ref 30.0–36.0)
MCV: 96.6 fL (ref 80.0–100.0)
Monocytes Absolute: 0.5 10*3/uL (ref 0.1–1.0)
Monocytes Relative: 8 %
Neutro Abs: 4.5 10*3/uL (ref 1.7–7.7)
Neutrophils Relative %: 67 %
Platelets: 294 10*3/uL (ref 150–400)
RBC: 4.46 MIL/uL (ref 4.22–5.81)
RDW: 13.7 % (ref 11.5–15.5)
WBC: 6.7 10*3/uL (ref 4.0–10.5)
nRBC: 0 % (ref 0.0–0.2)

## 2021-09-28 LAB — PROTIME-INR
INR: 0.9 (ref 0.8–1.2)
Prothrombin Time: 12.4 seconds (ref 11.4–15.2)

## 2021-09-28 LAB — COMPREHENSIVE METABOLIC PANEL
ALT: 21 U/L (ref 0–44)
AST: 25 U/L (ref 15–41)
Albumin: 4.1 g/dL (ref 3.5–5.0)
Alkaline Phosphatase: 99 U/L (ref 38–126)
Anion gap: 7 (ref 5–15)
BUN: 13 mg/dL (ref 8–23)
CO2: 28 mmol/L (ref 22–32)
Calcium: 9 mg/dL (ref 8.9–10.3)
Chloride: 100 mmol/L (ref 98–111)
Creatinine, Ser: 0.96 mg/dL (ref 0.61–1.24)
GFR, Estimated: >60 mL/min (ref >60)
Glucose, Bld: 100 mg/dL — ABNORMAL HIGH (ref 70–99)
Potassium: 4 mmol/L (ref 3.5–5.1)
Sodium: 135 mmol/L (ref 135–145)
Total Bilirubin: 0.3 mg/dL (ref 0.3–1.2)
Total Protein: 7.5 g/dL (ref 6.5–8.1)

## 2021-09-29 NOTE — Progress Notes (Addendum)
PT called scheduler on 09/29/21 at 1130am and stated he had misplaced his preop instructions from 09/28/21.  Nurse called and LVMM for pt at 1220 pm and informed pt to call preop nurse at [phone] and we could either go over instrucitons via phone or he could come by [facility] and he could pick up another copy.  Asked for call back to see how pt would like to proceed.   ?Pt called nruse back and staed he did not lose preop instrucitons but misplaced his planner.  Informed pt that his book planner was not in dept but if was found I would give him a call.   ?

## 2021-10-03 NOTE — Anesthesia Preprocedure Evaluation (Addendum)
Anesthesia Evaluation  ?Patient identified by MRN, date of birth, ID band ?Patient awake ? ? ? ?Reviewed: ?Allergy & Precautions, NPO status , Patient's Chart, lab work & pertinent test results ? ?Airway ?Mallampati: II ? ?TM Distance: >3 FB ?Neck ROM: Full ? ? ? Dental ?no notable dental hx. ?(+) Teeth Intact, Dental Advisory Given ?  ?Pulmonary ?Current Smoker and Patient abstained from smoking.,  ?  ?Pulmonary exam normal ?breath sounds clear to auscultation ? ? ? ? ? ? Cardiovascular ?hypertension, Pt. on medications ?Normal cardiovascular exam ?Rhythm:Regular Rate:Normal ? ? ?  ?Neuro/Psych ?Anxiety   ? GI/Hepatic ?Neg liver ROS, hiatal hernia,   ?Endo/Other  ?negative endocrine ROS ? Renal/GU ?  ? ?  ?Musculoskeletal ? ?(+) Arthritis ,  ? Abdominal ?  ?Peds ? Hematology ?Lab Results ?     Component                Value               Date                 ?     WBC                      6.7                 09/28/2021           ?     HGB                      13.7                09/28/2021           ?     HCT                      43.1                09/28/2021           ?     MCV                      96.6                09/28/2021           ?     PLT                      294                 09/28/2021           ?   ?Anesthesia Other Findings ?All: Codiene. Sulfa ? Reproductive/Obstetrics ? ?  ? ? ? ? ? ? ? ? ? ? ? ? ? ?  ?  ? ? ? ? ? ? ? ?Anesthesia Physical ?Anesthesia Plan ? ?ASA: 3 ? ?Anesthesia Plan: Regional and General  ? ?Post-op Pain Management: Regional block* and Minimal or no pain anticipated  ? ?Induction: Intravenous ? ?PONV Risk Score and Plan: 2 and Treatment may vary due to age or medical condition, Midazolam, Ondansetron and Dexamethasone ? ?Airway Management Planned: Oral ETT ? ?Additional Equipment: None ? ?Intra-op Plan:  ? ?Post-operative Plan: Extubation in OR ? ?Informed Consent: I have reviewed the patients History and Physical, chart, labs and discussed the  procedure including the risks, benefits and alternatives for the proposed  anesthesia with the patient or authorized representative who has indicated his/her understanding and acceptance.  ? ? ? ?Dental advisory given ? ?Plan Discussed with:  ? ?Anesthesia Plan Comments: (GA w L TAP block)  ? ? ? ? ? ?Anesthesia Quick Evaluation ? ?

## 2021-10-04 ENCOUNTER — Ambulatory Visit (HOSPITAL_COMMUNITY): Payer: Commercial Managed Care - HMO | Admitting: Physician Assistant

## 2021-10-04 ENCOUNTER — Encounter (HOSPITAL_COMMUNITY): Payer: Self-pay | Admitting: General Surgery

## 2021-10-04 ENCOUNTER — Ambulatory Visit (HOSPITAL_BASED_OUTPATIENT_CLINIC_OR_DEPARTMENT_OTHER): Payer: Commercial Managed Care - HMO | Admitting: Certified Registered Nurse Anesthetist

## 2021-10-04 ENCOUNTER — Ambulatory Visit (HOSPITAL_COMMUNITY)
Admission: RE | Admit: 2021-10-04 | Discharge: 2021-10-04 | Disposition: A | Discharge status: Home / Self Care | Payer: Commercial Managed Care - HMO | Attending: General Surgery | Admitting: General Surgery

## 2021-10-04 ENCOUNTER — Other Ambulatory Visit: Payer: Self-pay

## 2021-10-04 ENCOUNTER — Encounter (HOSPITAL_COMMUNITY)
Admission: RE | Disposition: A | Discharge status: Home / Self Care | Payer: Self-pay | Source: Home / Self Care | Attending: General Surgery

## 2021-10-04 DIAGNOSIS — G8929 Other chronic pain: Secondary | ICD-10-CM | POA: Diagnosis not present

## 2021-10-04 DIAGNOSIS — I10 Essential (primary) hypertension: Secondary | ICD-10-CM | POA: Insufficient documentation

## 2021-10-04 DIAGNOSIS — Z833 Family history of diabetes mellitus: Secondary | ICD-10-CM | POA: Insufficient documentation

## 2021-10-04 DIAGNOSIS — F419 Anxiety disorder, unspecified: Secondary | ICD-10-CM

## 2021-10-04 DIAGNOSIS — K449 Diaphragmatic hernia without obstruction or gangrene: Secondary | ICD-10-CM

## 2021-10-04 DIAGNOSIS — Z9079 Acquired absence of other genital organ(s): Secondary | ICD-10-CM | POA: Diagnosis not present

## 2021-10-04 DIAGNOSIS — Z79899 Other long term (current) drug therapy: Secondary | ICD-10-CM | POA: Diagnosis not present

## 2021-10-04 DIAGNOSIS — K4090 Unilateral inguinal hernia, without obstruction or gangrene, not specified as recurrent: Secondary | ICD-10-CM

## 2021-10-04 DIAGNOSIS — F17210 Nicotine dependence, cigarettes, uncomplicated: Secondary | ICD-10-CM | POA: Insufficient documentation

## 2021-10-04 DIAGNOSIS — Z79891 Long term (current) use of opiate analgesic: Secondary | ICD-10-CM | POA: Diagnosis not present

## 2021-10-04 HISTORY — PX: INGUINAL HERNIA REPAIR: SHX194

## 2021-10-04 HISTORY — DX: Personal history of traumatic brain injury: Z87.820

## 2021-10-04 SURGERY — REPAIR, HERNIA, INGUINAL, ADULT
Anesthesia: Regional | Laterality: Left

## 2021-10-04 MED ORDER — CHLORHEXIDINE GLUCONATE CLOTH 2 % EX PADS: 6.0000 | MEDICATED_PAD | Freq: Once | CUTANEOUS | Status: DC

## 2021-10-04 MED ORDER — BUPIVACAINE-EPINEPHRINE 0.25% -1:200000 IJ SOLN
INTRAMUSCULAR | Status: DC | PRN
  Administered 2021-10-04: 20 mL

## 2021-10-04 MED ORDER — LIDOCAINE HCL (PF) 2 % IJ SOLN
INTRAMUSCULAR | Status: AC
  Filled 2021-10-04: qty 5

## 2021-10-04 MED ORDER — BUPIVACAINE HCL (PF) 0.25 % IJ SOLN
INTRAMUSCULAR | Status: DC | PRN
  Administered 2021-10-04: 20 mL

## 2021-10-04 MED ORDER — DEXMEDETOMIDINE (PRECEDEX) IN NS 20 MCG/5ML (4 MCG/ML) IV SYRINGE
PREFILLED_SYRINGE | INTRAVENOUS | Status: AC
  Filled 2021-10-04: qty 5

## 2021-10-04 MED ORDER — BUPIVACAINE LIPOSOME 1.3 % IJ SUSP
INTRAMUSCULAR | Status: DC | PRN
Start: 2021-10-04 — End: 2021-10-04
  Administered 2021-10-04: 10 mL

## 2021-10-04 MED ORDER — KETOROLAC TROMETHAMINE 30 MG/ML IJ SOLN
INTRAMUSCULAR | Status: DC | PRN
  Administered 2021-10-04: 15 mg via INTRAVENOUS

## 2021-10-04 MED ORDER — FENTANYL CITRATE (PF) 250 MCG/5ML IJ SOLN
INTRAMUSCULAR | Status: AC
Start: 2021-10-04 — End: ?
  Filled 2021-10-04: qty 5

## 2021-10-04 MED ORDER — PHENYLEPHRINE 40 MCG/ML (10ML) SYRINGE FOR IV PUSH (FOR BLOOD PRESSURE SUPPORT)
PREFILLED_SYRINGE | INTRAVENOUS | Status: DC | PRN
  Administered 2021-10-04 (×2): 80 ug via INTRAVENOUS

## 2021-10-04 MED ORDER — PROPOFOL 10 MG/ML IV BOLUS
INTRAVENOUS | Status: DC | PRN
  Administered 2021-10-04: 100 mg via INTRAVENOUS

## 2021-10-04 MED ORDER — 0.9 % SODIUM CHLORIDE (POUR BTL) OPTIME
TOPICAL | Status: DC | PRN
  Administered 2021-10-04: 1000 mL

## 2021-10-04 MED ORDER — PROPOFOL 10 MG/ML IV BOLUS
INTRAVENOUS | Status: AC
  Filled 2021-10-04: qty 20

## 2021-10-04 MED ORDER — SUGAMMADEX SODIUM 200 MG/2ML IV SOLN
INTRAVENOUS | Status: DC | PRN
  Administered 2021-10-04: 130 mg via INTRAVENOUS

## 2021-10-04 MED ORDER — ACETAMINOPHEN 10 MG/ML IV SOLN: 1000.0000 mg | Freq: Once | INTRAVENOUS | Status: DC | PRN

## 2021-10-04 MED ORDER — DEXAMETHASONE SODIUM PHOSPHATE 10 MG/ML IJ SOLN
INTRAMUSCULAR | Status: DC | PRN
  Administered 2021-10-04: 5 mg via INTRAVENOUS

## 2021-10-04 MED ORDER — ONDANSETRON HCL 4 MG/2ML IJ SOLN
INTRAMUSCULAR | Status: DC | PRN
  Administered 2021-10-04: 4 mg via INTRAVENOUS

## 2021-10-04 MED ORDER — ROCURONIUM BROMIDE 10 MG/ML (PF) SYRINGE
PREFILLED_SYRINGE | INTRAVENOUS | Status: DC | PRN
  Administered 2021-10-04: 10 mg via INTRAVENOUS
  Administered 2021-10-04: 40 mg via INTRAVENOUS

## 2021-10-04 MED ORDER — MIDAZOLAM HCL 2 MG/2ML IJ SOLN
INTRAMUSCULAR | Status: DC | PRN
  Administered 2021-10-04: 2 mg via INTRAVENOUS

## 2021-10-04 MED ORDER — ONDANSETRON HCL 4 MG/2ML IJ SOLN
INTRAMUSCULAR | Status: AC
  Filled 2021-10-04: qty 2

## 2021-10-04 MED ORDER — LACTATED RINGERS IV SOLN: INTRAVENOUS | Status: DC

## 2021-10-04 MED ORDER — FENTANYL CITRATE PF 50 MCG/ML IJ SOSY: 25.0000 ug | PREFILLED_SYRINGE | INTRAMUSCULAR | Status: DC | PRN

## 2021-10-04 MED ORDER — ACETAMINOPHEN 500 MG PO TABS
1000.0000 mg | ORAL_TABLET | ORAL | Status: AC
  Administered 2021-10-04: 1000 mg via ORAL
  Filled 2021-10-04: qty 2

## 2021-10-04 MED ORDER — ROCURONIUM BROMIDE 10 MG/ML (PF) SYRINGE
PREFILLED_SYRINGE | INTRAVENOUS | Status: AC
  Filled 2021-10-04: qty 10

## 2021-10-04 MED ORDER — DEXAMETHASONE SODIUM PHOSPHATE 10 MG/ML IJ SOLN
INTRAMUSCULAR | Status: AC
  Filled 2021-10-04: qty 1

## 2021-10-04 MED ORDER — BUPIVACAINE-EPINEPHRINE (PF) 0.25% -1:200000 IJ SOLN
INTRAMUSCULAR | Status: AC
  Filled 2021-10-04: qty 30

## 2021-10-04 MED ORDER — FENTANYL CITRATE (PF) 250 MCG/5ML IJ SOLN
INTRAMUSCULAR | Status: DC | PRN
  Administered 2021-10-04: 100 ug via INTRAVENOUS
  Administered 2021-10-04: 50 ug via INTRAVENOUS

## 2021-10-04 MED ORDER — ENSURE PRE-SURGERY PO LIQD
296.0000 mL | Freq: Once | ORAL | Status: DC
  Filled 2021-10-04: qty 296

## 2021-10-04 MED ORDER — PHENYLEPHRINE 40 MCG/ML (10ML) SYRINGE FOR IV PUSH (FOR BLOOD PRESSURE SUPPORT)
PREFILLED_SYRINGE | INTRAVENOUS | Status: AC
  Filled 2021-10-04: qty 10

## 2021-10-04 MED ORDER — DEXMEDETOMIDINE (PRECEDEX) IN NS 20 MCG/5ML (4 MCG/ML) IV SYRINGE
PREFILLED_SYRINGE | INTRAVENOUS | Status: DC | PRN
  Administered 2021-10-04: 8 ug via INTRAVENOUS

## 2021-10-04 MED ORDER — LIDOCAINE HCL (CARDIAC) PF 100 MG/5ML IV SOSY
PREFILLED_SYRINGE | INTRAVENOUS | Status: DC | PRN
  Administered 2021-10-04: 100 mg via INTRAVENOUS

## 2021-10-04 MED ORDER — ONDANSETRON HCL 4 MG/2ML IJ SOLN: 4.0000 mg | Freq: Once | INTRAMUSCULAR | Status: DC | PRN

## 2021-10-04 MED ORDER — CEFAZOLIN SODIUM-DEXTROSE 2-4 GM/100ML-% IV SOLN
2.0000 g | INTRAVENOUS | Status: AC
  Administered 2021-10-04: 2 g via INTRAVENOUS
  Filled 2021-10-04: qty 100

## 2021-10-04 MED ORDER — CHLORHEXIDINE GLUCONATE 0.12 % MT SOLN
15.0000 mL | Freq: Once | OROMUCOSAL | Status: AC
  Administered 2021-10-04: 15 mL via OROMUCOSAL

## 2021-10-04 MED ORDER — MIDAZOLAM HCL 2 MG/2ML IJ SOLN
INTRAMUSCULAR | Status: AC
  Filled 2021-10-04: qty 2

## 2021-10-04 SURGICAL SUPPLY — 33 items
ADH SKN CLS APL DERMABOND .7 (GAUZE/BANDAGES/DRESSINGS) ×1
APL SKNCLS STERI-STRIP NONHPOA (GAUZE/BANDAGES/DRESSINGS)
BAG COUNTER SPONGE SURGICOUNT (BAG) IMPLANT
BAG SPNG CNTER NS LX DISP (BAG)
BENZOIN TINCTURE PRP APPL 2/3 (GAUZE/BANDAGES/DRESSINGS) IMPLANT
COVER SURGICAL LIGHT HANDLE (MISCELLANEOUS) ×2 IMPLANT
DERMABOND ADVANCED (GAUZE/BANDAGES/DRESSINGS) ×1
DERMABOND ADVANCED .7 DNX12 (GAUZE/BANDAGES/DRESSINGS) IMPLANT
DRAIN PENROSE 0.5X18 (DRAIN) ×1 IMPLANT
DRAPE LAPAROTOMY T 98X78 PEDS (DRAPES) ×2 IMPLANT
DRSG TEGADERM 4X4.75 (GAUZE/BANDAGES/DRESSINGS) IMPLANT
ELECT REM PT RETURN 15FT ADLT (MISCELLANEOUS) ×2 IMPLANT
GLOVE SRG 8 PF TXTR STRL LF DI (GLOVE) ×1 IMPLANT
GLOVE SURG POLY ORTHO LF SZ7.5 (GLOVE) ×2 IMPLANT
GLOVE SURG UNDER POLY LF SZ8 (GLOVE) ×2
GOWN STRL REUS W/ TWL XL LVL3 (GOWN DISPOSABLE) ×2 IMPLANT
GOWN STRL REUS W/TWL XL LVL3 (GOWN DISPOSABLE) ×4
KIT BASIN OR (CUSTOM PROCEDURE TRAY) ×2 IMPLANT
KIT TURNOVER KIT A (KITS) IMPLANT
MARKER SKIN DUAL TIP RULER LAB (MISCELLANEOUS) ×2 IMPLANT
MESH BARD SOFT 3X6IN (Mesh General) ×1 IMPLANT
NEEDLE HYPO 22GX1.5 SAFETY (NEEDLE) ×2 IMPLANT
PACK GENERAL/GYN (CUSTOM PROCEDURE TRAY) ×2 IMPLANT
SPIKE FLUID TRANSFER (MISCELLANEOUS) IMPLANT
STRIP CLOSURE SKIN 1/2X4 (GAUZE/BANDAGES/DRESSINGS) ×2 IMPLANT
SUT MNCRL AB 4-0 PS2 18 (SUTURE) ×2 IMPLANT
SUT PROLENE 2 0 CT2 30 (SUTURE) ×5 IMPLANT
SUT VIC AB 2-0 SH 27 (SUTURE) ×2
SUT VIC AB 2-0 SH 27X BRD (SUTURE) ×1 IMPLANT
SUT VIC AB 3-0 SH 18 (SUTURE) ×2 IMPLANT
SYR 20ML LL LF (SYRINGE) ×2 IMPLANT
TOWEL OR 17X26 10 PK STRL BLUE (TOWEL DISPOSABLE) ×2 IMPLANT
TOWEL OR NON WOVEN STRL DISP B (DISPOSABLE) ×2 IMPLANT

## 2021-10-04 NOTE — Anesthesia Postprocedure Evaluation (Signed)
Anesthesia Post Note ? ?Patient: [name] ? ?Procedure(s) Performed: OPEN LEFT INGUINAL HERNIA REPAIR WITH MESH (Left) ? ?  ? ?Patient location during evaluation: PACU ?Anesthesia Type: Regional and General ?Level of consciousness: awake and alert ?Pain management: pain level controlled ?Vital Signs Assessment: post-procedure vital signs reviewed and stable ?Respiratory status: spontaneous breathing, nonlabored ventilation, respiratory function stable and patient connected to nasal cannula oxygen ?Cardiovascular status: blood pressure returned to baseline and stable ?Postop Assessment: no apparent nausea or vomiting ?Anesthetic complications: no ? ? ?No notable events documented. ? ?Last Vitals:  ?Vitals:  ? 10/04/21 0930 10/04/21 0941  ?BP: 132/84 (!) 139/92  ?Pulse: 78 76  ?Resp: (!) 6 14  ?Temp: 36.6 ?C   ?SpO2: 98% 99%  ?  ?Last Pain:  ?Vitals:  ? 10/04/21 0941  ?TempSrc:   ?PainSc: 0-No pain  ? ? ?  ?  ?  ?  ?  ?  ? ?Barnet Glasgow ? ? ? ? ?

## 2021-10-04 NOTE — Anesthesia Procedure Notes (Signed)
Anesthesia Regional Block: TAP block  ? ?Pre-Anesthetic Checklist: , Correct Patient, Correct Site, Correct Laterality,  Correct Procedure, Correct Position, site marked,  Risks and benefits discussed,  At surgeon's request and post-op pain management ? ?Laterality: Left and N/A ? ?Prep: chloraprep     ?  ?Needles:  ?Injection technique: Single-shot ? ?Needle Type: Echogenic Needle   ? ? ?Needle Length: 9cm  ?Needle Gauge: 20  ? ? ? ?Additional Needles: ? ? ?Narrative:  ?Start time: 10/04/2021 6:55 AM ?End time: 10/04/2021 7:06 AM ? ?Performed by: Personally  ?Anesthesiologist: Barnet Glasgow, MD ? ? ? ? ?

## 2021-10-04 NOTE — Anesthesia Procedure Notes (Signed)
Procedure Name: Intubation ?Date/Time: 10/04/2021 7:27 AM ?Performed by: Raenette Rover, CRNA ?Pre-anesthesia Checklist: Patient identified, Emergency Drugs available, Suction available and Patient being monitored ?Patient Re-evaluated:Patient Re-evaluated prior to induction ?Oxygen Delivery Method: Circle system utilized ?Preoxygenation: Pre-oxygenation with 100% oxygen ?Induction Type: IV induction ?Ventilation: Mask ventilation without difficulty ?Laryngoscope Size: Mac and 4 ?Grade View: Grade I ?Tube type: Oral ?Tube size: 7.5 mm ?Number of attempts: 1 ?Airway Equipment and Method: Stylet ?Placement Confirmation: ETT inserted through vocal cords under direct vision, positive ETCO2 and breath sounds checked- equal and bilateral ?Secured at: 21 cm ?Tube secured with: Tape ?Dental Injury: Teeth and Oropharynx as per pre-operative assessment  ? ? ? ? ?

## 2021-10-04 NOTE — Interval H&P Note (Signed)
History and Physical Interval Note: ? ?10/04/2021 ?7:14 AM ? ?[name]  has presented today for surgery, with the diagnosis of LEFT INGUINAL HERNIA.  The various methods of treatment have been discussed with the patient and family. After consideration of risks, benefits and other options for treatment, the patient has consented to  Procedure(s): ?OPEN LEFT INGUINAL HERNIA REPAIR WITH MESH (Left) as a surgical intervention.  The patient's history has been reviewed, patient examined, no change in status, stable for surgery.  I have reviewed the patient's chart and labs.  Questions were answered to the patient's satisfaction.   ? ?Discussed surgical resident involvement with case ? ?Greer Pickerel ? ? ?

## 2021-10-04 NOTE — Op Note (Signed)
Open Left Indirect Inguinal Hernia Repair with Mesh Procedure Note ? ?Indications: The patient presented with a history of a left, reducible hernia.   ? ?Pre-operative Diagnosis: left reducible inguinal hernia ? ?Post-operative Diagnosis: same; indirect ? ?Surgeon: Greer Pickerel MD FACS ? ?Surgical Resident: Vernell Leep PGY-4 ? ?Anesthesia: General endotracheal anesthesia and TAP block ? ?Procedure Details  ?The patient was seen again in the Holding Room. The risks, benefits, complications, treatment options, and expected outcomes were discussed with the patient. The possibilities of reaction to medication, pulmonary aspiration, perforation of viscus, bleeding, recurrent infection, the need for additional procedures, and development of a complication requiring transfusion or further operation were discussed with the patient and/or family. The likelihood of success in repairing the hernia and returning the patient to their previous functional status is good.  There was concurrence with the proposed plan, and informed consent was obtained. The site of surgery was properly noted/marked. The patient was taken to the Operating Room, identified as [name], and the procedure verified as left inguinal hernia repair. A Time Out was held and the above information confirmed. ? ?The patient was placed in the supine position and underwent induction of anesthesia. The lower abdomen and groin was prepped with Chloraprep and draped in the standard fashion, and 0.25% Marcaine with epinephrine was used to anesthetize the skin over the mid-portion of the inguinal canal. An oblique incision was made. Dissection was carried down through the subcutaneous tissue with cautery to the external oblique fascia.  We opened the external oblique fascia along the direction of its fibers to the external ring.  The spermatic cord was circumferentially dissected bluntly and retracted with a Penrose drain.  The floor of the inguinal canal was  inspected and there was no direct defect.  We skeletonized the spermatic cord and separated out the indirect sac. The indirect sac was thin and it was entered. We could follow it down to inside the abdominal cavity. Since the hernia sac had been opened, It was ligated with 3-0 vicryl tie.  We could also palpate his penile prosthesis hardware/tubing medially under the muscle  We used a 3 x 6 inch piece of Bard soft mesh, which was cut into a keyhole shape.  This was secured with 2-0 Prolene, beginning at the pubic tubercle, running this along the shelving edge inferiorly. Superiorly, the mesh was secured to the internal oblique fascia with interrupted 2-0 Prolene sutures.  The tails of the mesh were sutured together behind the spermatic cord.  The mesh was tucked underneath the external oblique fascia laterally.  The external oblique fascia was reapproximated with 2-0 Vicryl.  3-0 Vicryl was used to close the subcutaneous tissues and 4-0 Monocryl was used to close the skin in subcuticular fashion.  Dermabond  was used to seal the incision.  The patient was then extubated and brought to the recovery room in stable condition.  All sponge, instrument, and needle counts were correct prior to closure and at the conclusion of the case. ? ?I was personally present & scrubbed during the entire  procedure except for skin closure and immediately available throughout the entire procedure, as documented in my operative note.  ? ?Estimated Blood Loss: Minimal ?          ?      ?Complications: None; patient tolerated the procedure well. ?        ?Disposition: PACU - hemodynamically stable. ?        ?Condition: stable ? ?[name], MD, FACS ?General, Bariatric, & Minimally Invasive Surgery ?[facility] ? ? ? ?

## 2021-10-04 NOTE — Transfer of Care (Signed)
Immediate Anesthesia Transfer of Care Note ? ?Patient: [name] ? ?Procedure(s) Performed: OPEN LEFT INGUINAL HERNIA REPAIR WITH MESH (Left) ? ?Patient Location: PACU ? ?Anesthesia Type:GA combined with regional for post-op pain ? ?Level of Consciousness: drowsy ? ?Airway & Oxygen Therapy: Patient Spontanous Breathing and Patient connected to face mask oxygen ? ?Post-op Assessment: Report given to RN and Post -op Vital signs reviewed and stable ? ?Post vital signs: Reviewed and stable ? ?Last Vitals:  ?Vitals Value Taken Time  ?BP 123/81 10/04/21 0855  ?Temp    ?Pulse 73 10/04/21 0857  ?Resp 10 10/04/21 0857  ?SpO2 100 % 10/04/21 0857  ?Vitals shown include unvalidated device data. ? ?Last Pain:  ?Vitals:  ? 10/04/21 0614  ?TempSrc: Oral  ?PainSc:   ?   ? ?  ? ?Complications: No notable events documented. ?

## 2021-10-04 NOTE — H&P (Signed)
REFERRING PHYSICIAN: Bard Herbert, MD ? ?PROVIDER: Zyheir Daft Leanne Chang, MD ? ?MRN: [id number] ?DOB: August 02, 1955 ?DATE OF ENCOUNTER: 09/15/2021 ? ?Subjective  ? ?Chief Complaint: New Consultation (Hernia) ? ? ?History of Present Illness: ?[name] is a 66 y.o. male who is seen today as an office consultation at the request of Dr. Sheryle Hail for evaluation of New Consultation (Hernia) ?.  ? ?He states that he noticed a bulge in his left groin about 2 weeks ago. It is present daily. At times it will be more noticeable generally after he has been up moving around. No nausea or vomiting. Daily bowel movements. He is having discomfort but denies burning, shooting or stabbing pain. He has had a robotic prostatectomy about 10 years ago he states. If years later he had a penile implant. He is on chronic Percocet for chronic back pain. He is also on medication for blood pressure. He states that he has an ultrasound of his leg in the next few days to look at varicose veins. He denies a family history of DVT. He states that his mom had diabetes not DVTs. He himself has not had a DVT or PE. He is sober for 8 years. He does smoke 1 pack/day. He denies any chest pain, chest pressure, shortness of breath, dyspnea on exertion, TIAs amaurosis fugax. ? ? ? ?Review of Systems: ?A complete review of systems was obtained from the patient. I have reviewed this information and discussed as appropriate with the patient. See HPI as well for other ROS. ? ?ROS  ? ?Medical History: ?Past Medical History:  ?Diagnosis Date  ? Anxiety  ? Arthritis  ? History of cancer  ? Hyperlipidemia  ? Hypertension  ? Liver disease  ? ?There is no problem list on file for this patient. ? ?Past Surgical History:  ?Procedure Laterality Date  ? penile implant  ? PROSTATE SURGERY  ? ? ?Allergies  ?Allergen Reactions  ? Codeine Other (See Comments)  ?Upset stomach ?Upset stomach ? ? ?Current Outpatient Medications on File Prior to Visit  ?Medication Sig Dispense  Refill  ? amLODIPine (NORVASC) 10 MG tablet Take 10 mg by mouth  ? cyclobenzaprine (FLEXERIL) 10 MG tablet Take 10 mg by mouth 3 (three) times daily  ? diazePAM (VALIUM) 10 MG tablet Take 5 mg by mouth  ? oxyCODONE-acetaminophen (PERCOCET) 10-325 mg tablet Take 1 tablet by mouth every 4 (four) hours as needed  ? rosuvastatin (CRESTOR) 5 MG tablet Take 5 mg by mouth once daily  ? ?No current facility-administered medications on file prior to visit.  ? ?Family History  ?Problem Relation Age of Onset  ? Deep vein thrombosis (DVT or abnormal blood clot formation) Mother  ? Breast cancer Mother  ? Skin cancer Father  ? Coronary Artery Disease (Blocked arteries around heart) Father  ? ? ?Social History  ? ?Tobacco Use  ?Smoking Status Every Day  ? Types: Cigarettes  ?Smokeless Tobacco Never  ? ? ?Social History  ? ?Socioeconomic History  ? Marital status: Married  ?Tobacco Use  ? Smoking status: Every Day  ?Types: Cigarettes  ? Smokeless tobacco: Never  ?Substance and Sexual Activity  ? Alcohol use: Not Currently  ? Drug use: Never  ? ?Objective:  ? ?Vitals:  ?09/15/21 1053  ?BP: 134/82  ?Weight: 60.4 kg (133 lb 3.2 oz)  ?Height: 162.6 cm ('5\' 4"'$ )  ? ?Body mass index is 22.86 kg/m?. ? ?Constitutional: NAD; conversant; no deformities ?Eyes: Moist conjunctiva; no lid lag; anicteric; PERRL ?  Neck: Trachea midline; no thyromegaly ?Lungs: Normal respiratory effort; no tactile fremitus ?CV: RRR; no palpable thrills; no pitting edema ?GI: Abd well-healed trocar incisions; no palpable hepatosplenomegaly ?GU: Obvious left groin bulge. Reducible. No bulge with Valsalva in right groin. ?MSK: Normal gait; no clubbing/cyanosis ?Psychiatric: Appropriate affect; alert and oriented x3 ?Lymphatic: No palpable cervical or axillary lymphadenopathy ?Skin: No rash, lesions or jaundice. Appears slightly older than stated age. ? ?Labs, Imaging and Diagnostic Testing: ?Reviewed a CBC and a bmet ? ?Assessment and Plan:  ? ? ?Diagnoses and all  orders for this visit: ? ?Left inguinal hernia ? ?Chronic narcotic use ? ?Essential hypertension ? ?History of penile implant ? ?History of prostatectomy ? ?History of alcohol abuse ? ? ? ?We discussed the etiology of inguinal hernias. We discussed the signs & symptoms of incarceration & strangulation. We discussed non-operative and operative management. ?His surgical history and comorbidities I think an open repair would be "safest ". we discussed open repair ?The patient has elected open repair of left inguinal hernia with mesh. ? ?I described the procedure in detail. The patient was given educational material. We discussed the risks and benefits including but not limited to bleeding, infection, chronic inguinal pain, nerve entrapment, hernia recurrence, mesh complications, hematoma formation, urinary retention, injury to the testicle, numbness in the groin, blood clots, injury to the surrounding structures, and anesthesia risk. We also discussed the typical post operative recovery course, including no heavy lifting for 4-6 weeks. I explained that the likelihood of improvement of their symptoms is good ? ?I do think he is at slight increased risk for recurrence as well as infection because of his tobacco use. His alcoholism is in remission. He has been sober for 8 years. We will check coags and a full panel labs preoperatively just to make sure there is no signs of underlying chronic liver disease. ? ?This patient encounter took 45 minutes today to perform the following: take history, perform exam, review outside records, interpret imaging, counsel the patient on their diagnosis and document encounter, findings & plan in the EHR ? ?No follow-ups on file. ? ?Katalena Malveaux Leanne Chang, MD  ?General, Minimally Invasive, & Bariatric Surgery ? ?  ? ?

## 2021-10-04 NOTE — Discharge Instructions (Signed)
[facility]  UMBILICAL OR INGUINAL HERNIA REPAIR: POST OP INSTRUCTIONS  Always review your discharge instruction sheet given to you by the facility where your surgery was performed. IF YOU HAVE DISABILITY OR FAMILY LEAVE FORMS, YOU MUST BRING THEM TO THE OFFICE FOR PROCESSING.   DO NOT GIVE THEM TO YOUR DOCTOR.  A  prescription for pain medication may be given to you upon discharge.  Take your pain medication as prescribed, if needed.  If narcotic pain medicine is not needed, then you may take acetaminophen (Tylenol) or ibuprofen (Advil) as needed. Take your usually prescribed medications unless otherwise directed. If you need a refill on your pain medication, please contact your pharmacy.  They will contact our office to request authorization. Prescriptions will not be filled after 5 pm or on week-ends. You should follow a light diet the first 24 hours after arrival home, such as soup and crackers, etc.  Be sure to include lots of fluids daily.  Resume your normal diet the day after surgery. Most patients will experience some swelling and bruising around the umbilicus or in the groin and scrotum.  Ice packs and reclining will help.  Swelling and bruising can take several days to resolve.  It is common to experience some constipation if taking pain medication after surgery.  Increasing fluid intake and taking a stool softener (such as Colace) will usually help or prevent this problem from occurring.  A mild laxative (Milk of Magnesia or Miralax) should be taken according to package directions if there are no bowel movements after 48 hours. Unless discharge instructions indicate otherwise, you may remove your bandages 24-48 hours after surgery, and you may shower at that time.  You may have steri-strips (small skin tapes) in place directly over the incision.  These strips should be left on the skin for 7-10 days.  If your surgeon used skin glue on the incision, you may shower in 24  hours.  The glue will flake off over the next 2-3 weeks.  Any sutures or staples will be removed at the office during your follow-up visit. ACTIVITIES:  You may resume regular (light) daily activities beginning the next day--such as daily self-care, walking, climbing stairs--gradually increasing activities as tolerated.  You may have sexual intercourse when it is comfortable.  Refrain from any heavy lifting or straining until approved by your doctor. You may drive when you are no longer taking prescription pain medication, you can comfortably wear a seatbelt, and you can safely maneuver your car and apply brakes. RETURN TO WORK:  You should see your doctor in the office for a follow-up appointment approximately 2-3 weeks after your surgery.  Make sure that you call for this appointment within a day or two after you arrive home to insure a convenient appointment time. OTHER INSTRUCTIONS:     WHEN TO CALL YOUR DOCTOR: Fever over 101.0 Inability to urinate Nausea and/or vomiting Extreme swelling or bruising Continued bleeding from incision. Increased pain, redness, or drainage from the incision  The clinic staff is available to answer your questions during regular business hours.  Please don't hesitate to call and ask to speak to one of the nurses for clinical concerns.  If you have a medical emergency, go to the nearest emergency room or call 911.  A surgeon from [facility] is always on call at the hospital   [address], [address], [place], [place]  [address] ?  [address] [address], [place], [place]     [address] [phone] ? [phone] ? FAX [phone] Web site: [email or web address]  

## 2021-10-05 ENCOUNTER — Encounter (HOSPITAL_COMMUNITY): Payer: Self-pay | Admitting: General Surgery

## 2021-10-11 ENCOUNTER — Ambulatory Visit
Admission: RE | Admit: 2021-10-11 | Discharge: 2021-10-11 | Disposition: A | Discharge status: Home / Self Care | Payer: Managed Care, Other (non HMO) | Source: Ambulatory Visit | Attending: Family Medicine | Admitting: Family Medicine

## 2021-10-11 ENCOUNTER — Other Ambulatory Visit: Payer: Self-pay

## 2021-10-11 DIAGNOSIS — M659 Synovitis and tenosynovitis, unspecified: Secondary | ICD-10-CM

## 2021-10-11 DIAGNOSIS — M2391 Unspecified internal derangement of right knee: Secondary | ICD-10-CM

## 2021-10-11 DIAGNOSIS — G8929 Other chronic pain: Secondary | ICD-10-CM

## 2021-10-11 IMAGING — MR MR KNEE*R* W/O CM
4 of 6 series · 17 of 40 positions shown · non-contrast
Comparison: None available

CLINICAL DATA: Right knee pain for 1.5 months.  No known injury.

EXAM:
MRI OF THE RIGHT KNEE WITHOUT CONTRAST
TECHNIQUE: Multiplanar, multisequence MR imaging of the knee was performed. No
intravenous contrast was administered.

[Series 3: T2 fat-sat · axial · 4.0mm · 0.29mm/px · z∈[-71,+17]mm · 3 of 30 slices shown (1 of 2)]
[im 5/30]
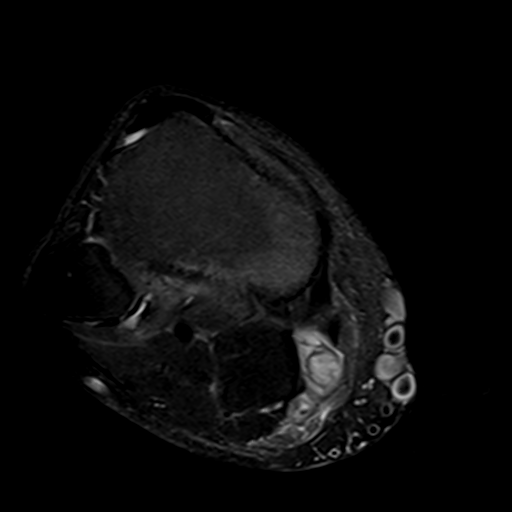
[im 17/30]
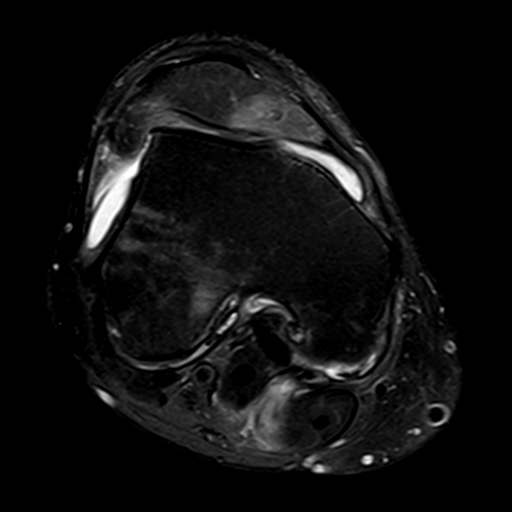
[im 25/30]
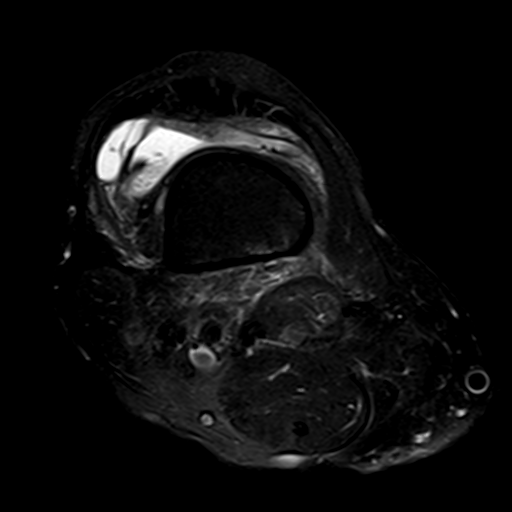

[Series 5: T2 fat-sat · coronal · 4.0mm · 0.29mm/px · 3 of 28 slices shown (2 of 2)]
[im 6/28]
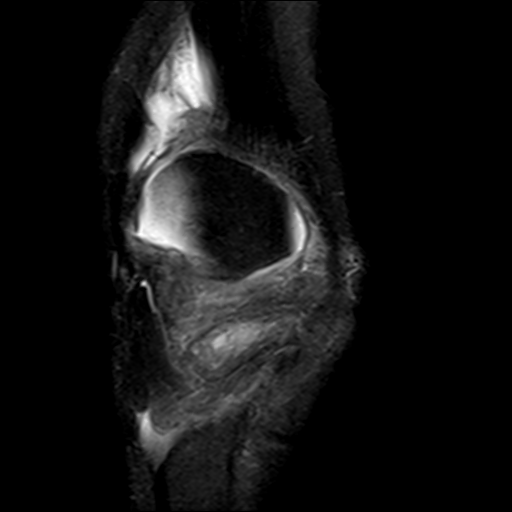
[im 17/28]
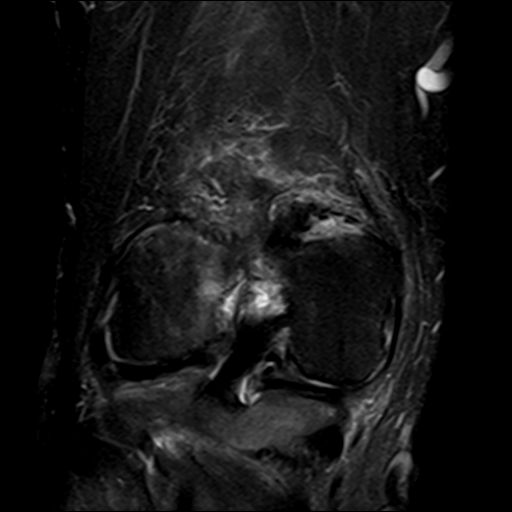
[im 28/28]
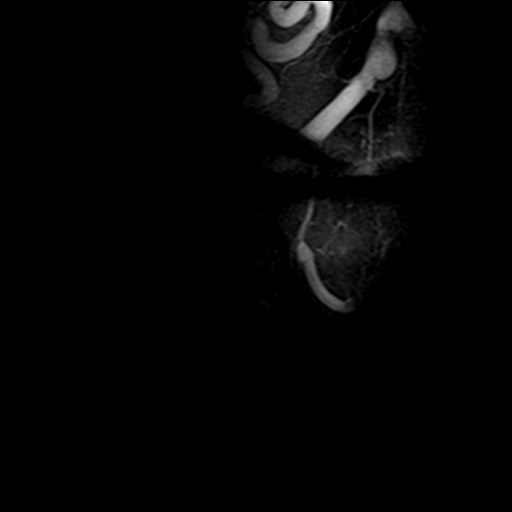

[Series 6: PD fat-sat · coronal · 4.0mm · 0.29mm/px · 6 of 28 slices shown (1 of 2)]
[im 1/28]
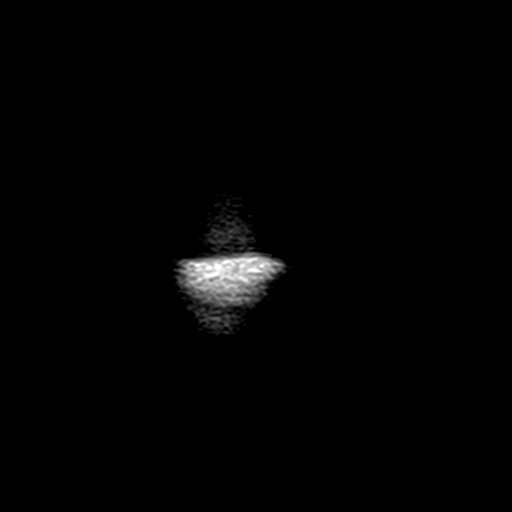
[im 6/28]
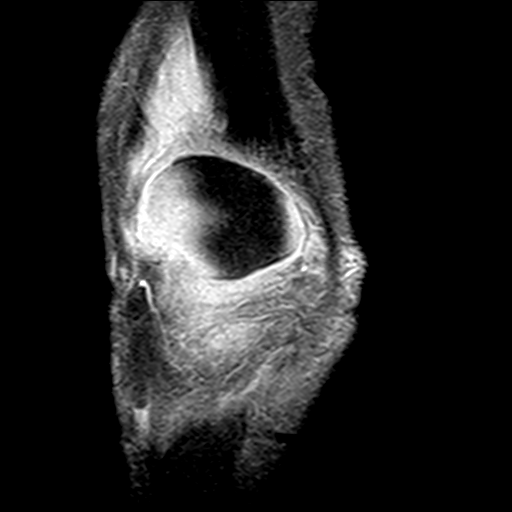
[im 11/28]
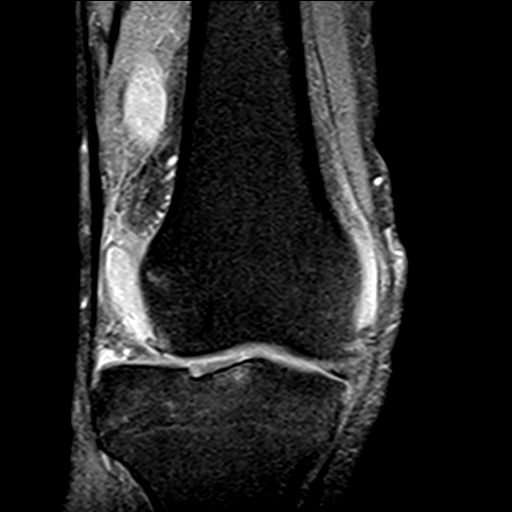
[im 17/28]
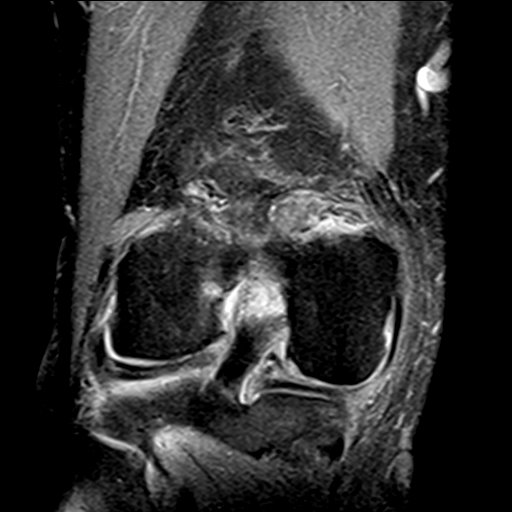
[im 22/28]
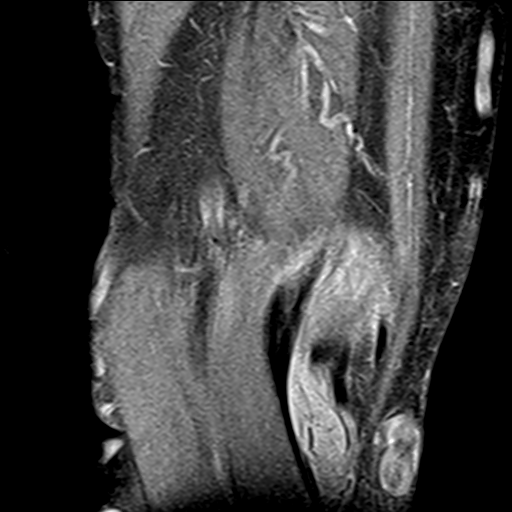
[im 28/28]
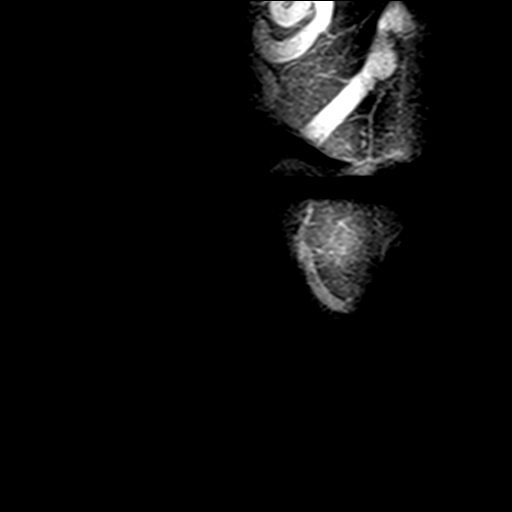

[Series 7: PD fat-sat · sagittal · 3.0mm · 0.29mm/px · 5 of 30 slices shown (2 of 2)]
[im 1/30]
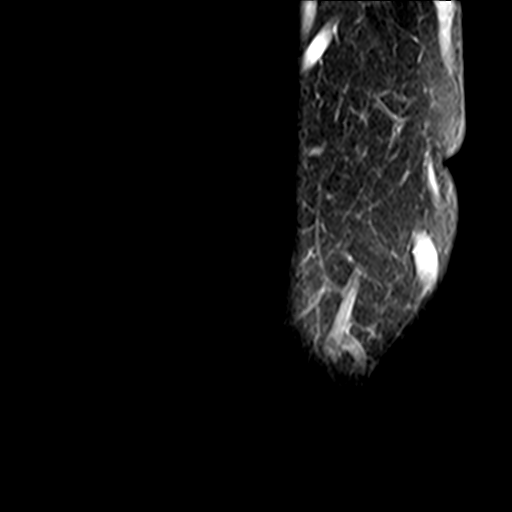
[im 5/30]
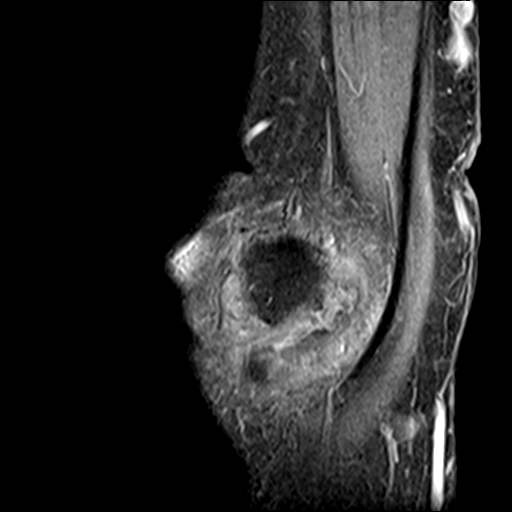
[im 10/30]
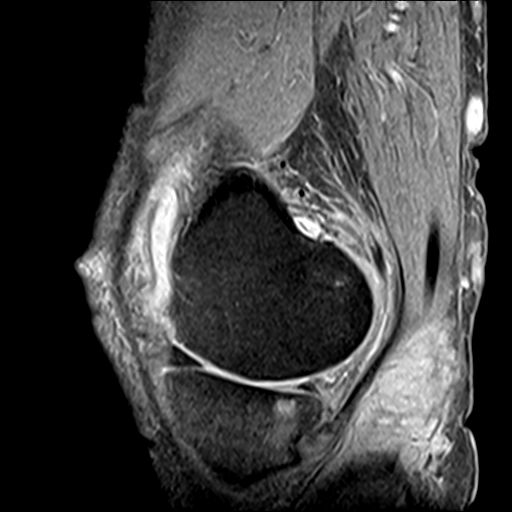
[im 15/30]
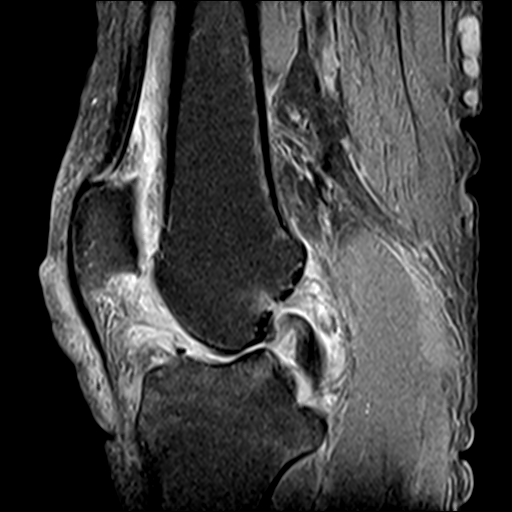
[im 25/30]
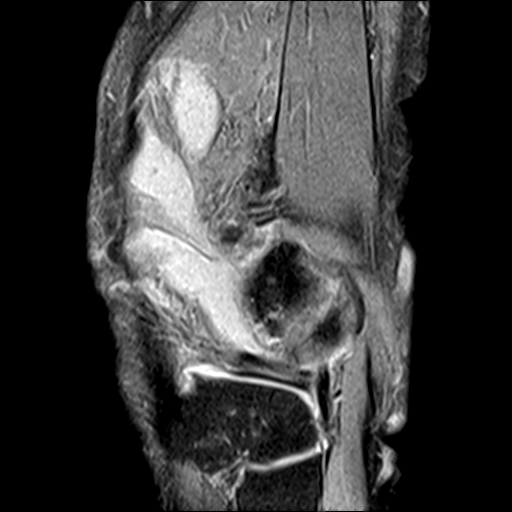

[17 of 40 positions shown; findings below may reference images not displayed]

FINDINGS: Despite efforts by the technologist and patient, motion artifact is
present on today's exam and could not be eliminated. This reduces
exam sensitivity and specificity.

MENISCI

Medial meniscus: Moderate extrusion of the body of the medial
meniscus with degenerative fraying of the superior articular surface
of the meniscal triangle. High-grade attenuation of the posterior
aspect of the body of the medial meniscus (coronal image 13) and
high-grade attenuation of near the entire posterior horn of the
medial meniscus. There is marked irregularity enlargement of the
root of the posterior horn of the medial meniscus (sagittal image 18
and coronal image 12), a complex tear.

Lateral meniscus: There is absence of meniscal tissue within the
entire anterior horn of the lateral meniscus. There is horizontal
linear increased proton density signal indicating a tear extending
from the inferior aspect of the lateral wall through the superior
articular surface of the central third of the meniscal triangle of
the anterior segment of the body of the lateral meniscus (coronal
image 17).

LIGAMENTS

Cruciates: The ACL is not well visualized and there appears to be a
full-thickness tear. The PCL is intact.

Collaterals: The medial collateral ligament is intact. The fibular
collateral ligament, biceps femoris tendon, iliotibial band, and
popliteus tendon are intact.

CARTILAGE

Patellofemoral: Mild thinning of the lateral patellar facet
cartilage diffusely. Moderate lateral trochlear cartilage thinning
diffusely. Multifocal full-thickness cartilage loss throughout the
medial trochlea.

Medial: Full-thickness cartilage loss throughout the mid to medial
aspect of the weight-bearing medial femoral condyle and medial
aspect of the medial tibial plateau. Moderate subchondral cystic
change within the posteromedial tibial plateau. There is moderate
marrow edema throughout the posterior greater than anterior aspect
of the medial tibial plateau extending into the tibial spines.

Lateral: Full-thickness cartilage loss within the anterior greater
than posterior aspects of the medial portion of the weight-bearing
lateral femoral condyle and lateral tibial plateau with moderate
subchondral marrow edema.

Joint: Moderate-to-largejoint effusion. High-grade edema throughout
Hoffa's fat pad. No plical thickening.

Popliteal Fossa: There is a Baker's cyst measuring up to 2.7 x
by at least 5 cm (transverse by AP by craniocaudal), extending off
the inferior plantar view. Numerous thin moderate internal
septations greatest inferiorly. Possible partially ruptured
component extending inferiorly superficial to the medial head of the
gastrocnemius muscle.

Extensor Mechanism: Intact quadriceps tendon. Mild proximal patellar
tendinosis.

Bones: There appears to be mild anterior and lateral subluxation of
the proximal tibia with respect to the distal femur.

Other: Medial subcutaneous fat varicose veins are noted.
IMPRESSION: :
IMPRESSION: 1. Severe medial and lateral compartment and moderate patellofemoral
compartment osteoarthritis.
2. Moderate-to-large joint effusion. Moderate-sized, mildly complex
Baker's cyst.
3. Absence of the entire anterior horn of the lateral meniscus.
Transverse tear of the body of the lateral meniscus.
4. Complex degenerative tearing throughout the body and posterior
horn of the medial meniscus.
5. Full-thickness tear of the ACL, age indeterminate.
6. Mild proximal patellar tendinosis.
7. Apparent mild anterior and lateral subluxation of the proximal
tibia with respect of the distal femur. This may be secondary to the
osteoarthritis.

## 2022-09-14 ENCOUNTER — Encounter: Payer: Self-pay | Admitting: Gastroenterology

## 2023-01-06 ENCOUNTER — Other Ambulatory Visit: Payer: Self-pay

## 2023-01-06 ENCOUNTER — Emergency Department (HOSPITAL_COMMUNITY)
Admission: EM | Admit: 2023-01-06 | Discharge: 2023-01-06 | Disposition: A | Discharge status: Home / Self Care | Payer: Medicare HMO | Attending: Emergency Medicine | Admitting: Emergency Medicine

## 2023-01-06 DIAGNOSIS — S01111A Laceration without foreign body of right eyelid and periocular area, initial encounter: Secondary | ICD-10-CM | POA: Insufficient documentation

## 2023-01-06 DIAGNOSIS — W01198A Fall on same level from slipping, tripping and stumbling with subsequent striking against other object, initial encounter: Secondary | ICD-10-CM | POA: Insufficient documentation

## 2023-01-06 DIAGNOSIS — S0993XA Unspecified injury of face, initial encounter: Secondary | ICD-10-CM | POA: Diagnosis present

## 2023-01-06 DIAGNOSIS — S0181XA Laceration without foreign body of other part of head, initial encounter: Secondary | ICD-10-CM

## 2023-01-06 MED ORDER — BACITRACIN ZINC 500 UNIT/GM EX OINT: 1.0000 | TOPICAL_OINTMENT | Freq: Two times a day (BID) | CUTANEOUS | 0 refills | Status: DC

## 2023-01-06 MED ORDER — LIDOCAINE-EPINEPHRINE (PF) 2 %-1:200000 IJ SOLN
10.0000 mL | Freq: Once | INTRAMUSCULAR | Status: AC
  Administered 2023-01-06: 10 mL
  Filled 2023-01-06: qty 20

## 2023-01-06 MED ORDER — BACITRACIN ZINC 500 UNIT/GM EX OINT
TOPICAL_OINTMENT | CUTANEOUS | Status: AC
  Filled 2023-01-06: qty 0.9

## 2023-01-06 NOTE — ED Triage Notes (Signed)
Pt arrived via POV. Pt has approx 4cm laceration to R upper eyelid after tripping and hitting head on cabinet yesterday. No LOC. Pt endorses ETOH use prior to fall. Pt states they had 1 beer this AM  AOx4

## 2023-01-06 NOTE — Discharge Instructions (Signed)
1.  Keep your wound clean by using a half water and half hydrogen peroxide with a Q-tip to clean the wound.  You can then rinse with clean water and dry well.  Apply bacitracin ointment twice a day.  Try to keep the wound covered.  This will make it easier to remove stitches.  Stitches should be removed in 4 to 5 days.

## 2023-01-06 NOTE — ED Provider Notes (Signed)
[place] EMERGENCY DEPARTMENT AT [facility] Provider Note   CSN: [id number] Arrival date & time: 01/06/23  1500     History  Chief Complaint  Patient presents with   Facial Laceration    [name] is a 66 y.o. male.  HPI Patient reports that he fell yesterday evening at 11 PM and sustained a cut to his eyelid on the right side.  Patient reports that he had alcohol consumption at that time.  Denies any loss of consciousness.  Denies neck pain.  Today he noted that the laceration was worse than he had thought.  Is come to the emergency department for evaluation and treatment for his laceration.  Patient denies any other injury.  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  No change in vision since falling last night.  Patient denies any headache.  He does not take any blood thinners.    Home Medications Prior to Admission medications   Medication Sig Start Date End Date Taking? Authorizing Provider  bacitracin ointment Apply 1 Application topically 2 (two) times daily. 01/06/23  Yes Arby Barrette, MD  amLODipine (NORVASC) 10 MG tablet Take 10 mg by mouth every morning.     [provider]  Ascorbic Acid (VITAMIN C) 1000 MG tablet Take 1,000 mg by mouth daily.    [provider]  cyclobenzaprine (FLEXERIL) 10 MG tablet Take 10 mg by mouth 3 (three) times daily as needed for muscle spasms.    [provider]  diazepam (VALIUM) 10 MG tablet Take 10 mg by mouth every 12 (twelve) hours as needed for anxiety.    [provider]  docusate sodium (COLACE) 100 MG capsule Take 300 mg by mouth at bedtime.    [provider]  docusate sodium (COLACE) 50 MG capsule Take 200 mg by mouth at bedtime.    [provider]  gabapentin (NEURONTIN) 400 MG capsule Take 400 mg by mouth at bedtime.    [provider]  Misc Natural Products (GLUCOSAMINE CHOND COMPLEX/MSM PO) Take 1  tablet by mouth daily.    [provider]  Multiple Vitamin (MULTIVITAMIN) tablet Take 1 tablet by mouth daily.    [provider]  Omega-3 Fatty Acids (OMEGA-3 FISH OIL PO) Take 2,000 mg by mouth daily.    [provider]  oxyCODONE-acetaminophen (PERCOCET) 10-325 MG tablet Take 1 tablet by mouth every 4 (four) hours as needed for pain.    [provider]  rosuvastatin (CRESTOR) 5 MG tablet Take 5 mg by mouth daily.    [provider]  Sennosides 25 MG TABS Take 25 mg by mouth at bedtime.    [provider]  TURMERIC PO Take 1,500 mg by mouth daily.    [provider]  vitamin B-12 (CYANOCOBALAMIN) 1000 MCG tablet Take 1,000 mcg by mouth daily.    [provider]  Vitamin D-Vitamin K (VITAMIN K2-VITAMIN D3 PO) Take 1 tablet by mouth daily.    [provider]      Allergies    Codeine and Sulfa antibiotics    Review of Systems   Review of Systems  Physical Exam Updated Vital Signs BP 129/86 (BP Location: Right Arm)   Pulse (!) 105   Temp 98.3 F (36.8 C) (Oral)   Resp 16   Ht 5\' 3"  (1.6 m)   Wt 65.8 kg   SpO2 98%   BMI 25.69 kg/m  Physical Exam Constitutional:  Comments: Alert nontoxic.  No respiratory distress.  HENT:     Head:     Comments: Irregular laceration to the eyelid and brow on the right.  See attached image.  There is gaping present.  Periorbital hematoma without swelling.  Ocular motions intact.  The eye itself does not appear to have any trauma.  Pupil is responsive.  No hyphema.    Nose: Nose normal.     Mouth/Throat:     Pharynx: Oropharynx is clear.  Eyes:     Extraocular Movements: Extraocular movements intact.  Pulmonary:     Effort: Pulmonary effort is normal.  Skin:    General: Skin is warm and dry.  Neurological:     General: No focal deficit present.     Mental Status: He is oriented to person, place, and time.     ED Results / Procedures / Treatments    Labs (all labs ordered are listed, but only abnormal results are displayed) Labs Reviewed - No data to display  EKG None  Radiology No results found.  Procedures .Marland KitchenLaceration Repair  Date/Time: 01/06/2023 4:29 PM  Performed by: Arby Barrette, MD Authorized by: Arby Barrette, MD   Consent:    Consent obtained:  Verbal   Consent given by:  Patient   Risks discussed:  Infection, pain, poor cosmetic result and poor wound healing Anesthesia:    Anesthesia method:  Local infiltration   Local anesthetic:  Lidocaine 2% WITH epi Laceration details:    Location:  Face   Face location:  R upper eyelid   Extent:  Full thickness   Length (cm):  3   Depth (mm):  5 Pre-procedure details:    Preparation:  Patient was prepped and draped in usual sterile fashion Exploration:    Hemostasis achieved with:  Epinephrine and direct pressure   Wound extent: areolar tissue violated     Contaminated: no   Treatment:    Area cleansed with:  Saline and Shur-Clens   Amount of cleaning:  Extensive   Irrigation solution:  Sterile saline   Irrigation volume:  50   Irrigation method:  Syringe   Debridement:  None   Undermining:  None Skin repair:    Repair method:  Sutures   Suture size:  6-0   Suture material:  Nylon   Suture technique:  Simple interrupted   Number of sutures:  10 Approximation:    Approximation:  Close Repair type:    Repair type:  Complex Post-procedure details:    Dressing:  Antibiotic ointment   Procedure completion:  Tolerated well, no immediate complications Comments:     Complex laceration with flap from top of the eyelid into the brow extending beyond the brow.  Visualizing the depth of the wound, this appears to have skieved superficially without apparent laceration to lacrimal gland. irrigated extensively.  Cleaned.  approximated well.       Medications Ordered in ED Medications  lidocaine-EPINEPHrine (XYLOCAINE W/EPI) 2 %-1:200000 (PF) injection 10 mL  (10 mLs Infiltration Given 01/06/23 1551)    ED Course/ Medical Decision Making/ A&P                             Medical Decision Making Risk OTC drugs. Prescription drug management.   Patient fell last night crating laceration above his eye.  Reports that he was intoxicated at the time and did not seek care immediately.  He denies any blood thinners.  He  denies headache.  He denies loss of consciousness.  Clinically no sign of intracranial bleed or significant head trauma.  Normal extraocular motions.  Baseline neurologic function.  At this time I do not think CT head indicated.  Complex laceration repaired as outlined.  Patient extensively counseled on home care for the laceration and suture removal.  We discussed history of alcoholism.  Patient had been sober for approximately 8 years and has started drinking heavily again within the past 4 months.  Advises he does anticipate going back into a treatment program again and quitting.  This time no signs of alcohol withdrawal.  Mental status is clear.  Vital signs stable.        Final Clinical Impression(s) / ED Diagnoses Final diagnoses:  Facial laceration, initial encounter    Rx / DC Orders ED Discharge Orders          Ordered    bacitracin ointment  2 times daily        01/06/23 1627              Arby Barrette, MD 01/06/23 1639

## 2023-03-21 ENCOUNTER — Other Ambulatory Visit: Payer: Self-pay | Admitting: Orthopedic Surgery

## 2023-03-21 DIAGNOSIS — M5416 Radiculopathy, lumbar region: Secondary | ICD-10-CM

## 2023-04-04 ENCOUNTER — Encounter: Payer: Self-pay | Admitting: Orthopedic Surgery

## 2023-04-08 ENCOUNTER — Ambulatory Visit
Admission: RE | Admit: 2023-04-08 | Discharge: 2023-04-08 | Disposition: A | Discharge status: Home / Self Care | Payer: Medicare HMO | Source: Ambulatory Visit | Attending: Orthopedic Surgery | Admitting: Orthopedic Surgery

## 2023-04-08 DIAGNOSIS — M5416 Radiculopathy, lumbar region: Secondary | ICD-10-CM

## 2023-06-11 ENCOUNTER — Encounter (HOSPITAL_COMMUNITY): Payer: Self-pay

## 2023-06-11 ENCOUNTER — Emergency Department (HOSPITAL_COMMUNITY): Payer: Medicare HMO

## 2023-06-11 ENCOUNTER — Other Ambulatory Visit: Payer: Self-pay

## 2023-06-11 ENCOUNTER — Emergency Department (HOSPITAL_COMMUNITY)
Admission: EM | Admit: 2023-06-11 | Discharge: 2023-06-11 | Disposition: A | Discharge status: Home / Self Care | Payer: Medicare HMO | Attending: Emergency Medicine | Admitting: Emergency Medicine

## 2023-06-11 DIAGNOSIS — Y908 Blood alcohol level of 240 mg/100 ml or more: Secondary | ICD-10-CM | POA: Insufficient documentation

## 2023-06-11 DIAGNOSIS — Z23 Encounter for immunization: Secondary | ICD-10-CM | POA: Insufficient documentation

## 2023-06-11 DIAGNOSIS — S0231XB Fracture of orbital floor, right side, initial encounter for open fracture: Secondary | ICD-10-CM | POA: Diagnosis not present

## 2023-06-11 DIAGNOSIS — S01111A Laceration without foreign body of right eyelid and periocular area, initial encounter: Secondary | ICD-10-CM | POA: Insufficient documentation

## 2023-06-11 DIAGNOSIS — I10 Essential (primary) hypertension: Secondary | ICD-10-CM | POA: Diagnosis not present

## 2023-06-11 DIAGNOSIS — Y92009 Unspecified place in unspecified non-institutional (private) residence as the place of occurrence of the external cause: Secondary | ICD-10-CM | POA: Diagnosis not present

## 2023-06-11 DIAGNOSIS — W19XXXA Unspecified fall, initial encounter: Secondary | ICD-10-CM | POA: Diagnosis not present

## 2023-06-11 DIAGNOSIS — F10129 Alcohol abuse with intoxication, unspecified: Secondary | ICD-10-CM | POA: Insufficient documentation

## 2023-06-11 DIAGNOSIS — F10920 Alcohol use, unspecified with intoxication, uncomplicated: Secondary | ICD-10-CM

## 2023-06-11 DIAGNOSIS — S0591XA Unspecified injury of right eye and orbit, initial encounter: Secondary | ICD-10-CM | POA: Diagnosis present

## 2023-06-11 DIAGNOSIS — S022XXA Fracture of nasal bones, initial encounter for closed fracture: Secondary | ICD-10-CM

## 2023-06-11 DIAGNOSIS — S022XXB Fracture of nasal bones, initial encounter for open fracture: Secondary | ICD-10-CM | POA: Diagnosis not present

## 2023-06-11 LAB — CBC WITH DIFFERENTIAL/PLATELET
Abs Immature Granulocytes: 0.03 10*3/uL (ref 0.00–0.07)
Basophils Absolute: 0 10*3/uL (ref 0.0–0.1)
Basophils Relative: 0 %
Eosinophils Absolute: 0.2 10*3/uL (ref 0.0–0.5)
Eosinophils Relative: 2 %
HCT: 40.5 % (ref 39.0–52.0)
Hemoglobin: 13.6 g/dL (ref 13.0–17.0)
Immature Granulocytes: 0 %
Lymphocytes Relative: 20 %
Lymphs Abs: 2 10*3/uL (ref 0.7–4.0)
MCH: 35.3 pg — ABNORMAL HIGH (ref 26.0–34.0)
MCHC: 33.6 g/dL (ref 30.0–36.0)
MCV: 105.2 fL — ABNORMAL HIGH (ref 80.0–100.0)
Monocytes Absolute: 0.8 10*3/uL (ref 0.1–1.0)
Monocytes Relative: 9 %
Neutro Abs: 6.8 10*3/uL (ref 1.7–7.7)
Neutrophils Relative %: 69 %
Platelets: 218 10*3/uL (ref 150–400)
RBC: 3.85 MIL/uL — ABNORMAL LOW (ref 4.22–5.81)
RDW: 14.3 % (ref 11.5–15.5)
WBC: 9.8 10*3/uL (ref 4.0–10.5)
nRBC: 0 % (ref 0.0–0.2)

## 2023-06-11 LAB — COMPREHENSIVE METABOLIC PANEL
ALT: 32 U/L (ref 0–44)
AST: 41 U/L (ref 15–41)
Albumin: 4 g/dL (ref 3.5–5.0)
Alkaline Phosphatase: 86 U/L (ref 38–126)
Anion gap: 11 (ref 5–15)
BUN: 23 mg/dL (ref 8–23)
CO2: 23 mmol/L (ref 22–32)
Calcium: 8.8 mg/dL — ABNORMAL LOW (ref 8.9–10.3)
Chloride: 109 mmol/L (ref 98–111)
Creatinine, Ser: 1.16 mg/dL (ref 0.61–1.24)
GFR, Estimated: >60 mL/min (ref >60)
Glucose, Bld: 104 mg/dL — ABNORMAL HIGH (ref 70–99)
Potassium: 3.8 mmol/L (ref 3.5–5.1)
Sodium: 143 mmol/L (ref 135–145)
Total Bilirubin: 0.6 mg/dL (ref <1.2)
Total Protein: 7.5 g/dL (ref 6.5–8.1)

## 2023-06-11 LAB — ETHANOL: Alcohol, Ethyl (B): 273 mg/dL — ABNORMAL HIGH (ref <10)

## 2023-06-11 MED ORDER — TETANUS-DIPHTH-ACELL PERTUSSIS 5-2.5-18.5 LF-MCG/0.5 IM SUSY
0.5000 mL | PREFILLED_SYRINGE | Freq: Once | INTRAMUSCULAR | Status: AC
  Administered 2023-06-11: 0.5 mL via INTRAMUSCULAR
  Filled 2023-06-11: qty 0.5

## 2023-06-11 MED ORDER — CEFAZOLIN SODIUM-DEXTROSE 2-4 GM/100ML-% IV SOLN
2.0000 g | Freq: Once | INTRAVENOUS | Status: DC
  Filled 2023-06-11: qty 100

## 2023-06-11 MED ORDER — AMOXICILLIN-POT CLAVULANATE 875-125 MG PO TABS: 1.0000 | ORAL_TABLET | Freq: Two times a day (BID) | ORAL | 0 refills | Status: AC

## 2023-06-11 MED ORDER — LIDOCAINE-EPINEPHRINE (PF) 2 %-1:200000 IJ SOLN
10.0000 mL | Freq: Once | INTRAMUSCULAR | Status: AC
  Administered 2023-06-11: 10 mL
  Filled 2023-06-11: qty 20

## 2023-06-11 MED ORDER — LIDOCAINE-EPINEPHRINE-TETRACAINE (LET) TOPICAL GEL
3.0000 mL | Freq: Once | TOPICAL | Status: DC
  Filled 2023-06-11: qty 3

## 2023-06-11 NOTE — ED Notes (Signed)
Consult for ENT called at [phone]. Left a message.

## 2023-06-11 NOTE — Discharge Instructions (Signed)
You are found to multiple facial fractures which require follow-up with Dr. Ross Marcus in his clinic. This follow up appointment should be completed within the week. We also recommend follow-up with an eye doctor.  We have provided a referral to Dr. Sherrine Maples.  Take Augmentin as prescribed until finished.  You may use Tylenol or ibuprofen for management of pain.    You sustained a laceration to your right eyebrow.  This was repaired in the emergency department.  Avoid soaking your wound in stagnant or dirty water such as while taking a bath. You can shower normally. Keep the area clean with mild soap and warm water. Do not apply peroxide or alcohol to your wound as this can break down newly forming skin and prolong wound healing. If you keep the area bandaged, change the dressing/bandage at least once per day. Have your staples/sutures removed in 7 days.  Return to the emergency department for new or concerning symptoms.

## 2023-06-11 NOTE — ED Notes (Signed)
ENT trauma   Antony Madura, Cordelia Poche 06/11/23 1610

## 2023-06-11 NOTE — ED Triage Notes (Signed)
Pt BIB EMS from home for an unwitnessed fall. Pt has a laceration over his right eye and a nose deformity. Pt has slurred speech and smells like alcohol per EMS  Hx of broken nose Negative on stroke screen  152/84 94 hr 20 rr 96% RA 126 cbg

## 2023-06-11 NOTE — ED Provider Notes (Signed)
[place] EMERGENCY DEPARTMENT AT [facility] Provider Note   CSN: [id number] Arrival date & time: 06/11/23  0430     History  Chief Complaint  Patient presents with   Marletta Lor    [name] is a 67 y.o. male.  67 year old male with a history of hypertension, hyperlipidemia, substance abuse presents to the emergency department for evaluation.  EMS report an unwitnessed fall at home causing patient to sustain a laceration to his right eyebrow.  There is a notable deformity to the nose, though does have a history of nasal bone fracture.  Patient states that he last recalls being at his family members home tonight.  He is unsure of the mechanism regarding his fall.  He does endorse alcohol use tonight; denies illicit drug use. Tdap unknown.  The history is provided by the patient. No language interpreter was used.  Fall       Home Medications Prior to Admission medications   Medication Sig Start Date End Date Taking? Authorizing Provider  amLODipine (NORVASC) 10 MG tablet Take 10 mg by mouth every morning.     [provider]  Ascorbic Acid (VITAMIN C) 1000 MG tablet Take 1,000 mg by mouth daily.    [provider]  bacitracin ointment Apply 1 Application topically 2 (two) times daily. 01/06/23   Arby Barrette, MD  cyclobenzaprine (FLEXERIL) 10 MG tablet Take 10 mg by mouth 3 (three) times daily as needed for muscle spasms.    [provider]  diazepam (VALIUM) 10 MG tablet Take 10 mg by mouth every 12 (twelve) hours as needed for anxiety.    [provider]  docusate sodium (COLACE) 100 MG capsule Take 300 mg by mouth at bedtime.    [provider]  docusate sodium (COLACE) 50 MG capsule Take 200 mg by mouth at bedtime.    [provider]  gabapentin (NEURONTIN) 400 MG capsule Take 400 mg by mouth at bedtime.    [provider]  Misc Natural Products (GLUCOSAMINE CHOND COMPLEX/MSM PO) Take 1 tablet by  mouth daily.    [provider]  Multiple Vitamin (MULTIVITAMIN) tablet Take 1 tablet by mouth daily.    [provider]  Omega-3 Fatty Acids (OMEGA-3 FISH OIL PO) Take 2,000 mg by mouth daily.    [provider]  oxyCODONE-acetaminophen (PERCOCET) 10-325 MG tablet Take 1 tablet by mouth every 4 (four) hours as needed for pain.    [provider]  rosuvastatin (CRESTOR) 5 MG tablet Take 5 mg by mouth daily.    [provider]  Sennosides 25 MG TABS Take 25 mg by mouth at bedtime.    [provider]  TURMERIC PO Take 1,500 mg by mouth daily.    [provider]  vitamin B-12 (CYANOCOBALAMIN) 1000 MCG tablet Take 1,000 mcg by mouth daily.    [provider]  Vitamin D-Vitamin K (VITAMIN K2-VITAMIN D3 PO) Take 1 tablet by mouth daily.    [provider]      Allergies    Codeine and Sulfa antibiotics    Review of Systems   Review of Systems Ten systems reviewed and are negative for acute change, except as noted in the HPI.    Physical Exam Updated Vital Signs BP 124/80   Pulse (!) 103   Temp 98.1 F (36.7 C) (Oral)   Resp 16   SpO2 96%   Physical Exam Vitals and nursing note reviewed.  Constitutional:  General: He is not in acute distress.    Appearance: He is well-developed. He is not diaphoretic.     Comments: Speech slurred. Smells of ETOH.  HENT:     Head: Normocephalic.     Nose:     Comments: Bruising and deformity to bridge of nose. Nares patent. No septal deviation, hematoma. Eyes:     General: No scleral icterus.    Conjunctiva/sclera: Conjunctivae normal.     Comments: Large gaping laceration to R eyebrow  Pulmonary:     Effort: Pulmonary effort is normal. No respiratory distress.     Breath sounds: No stridor. No wheezing.     Comments: Respirations even and unlabored Musculoskeletal:        General: Normal range of motion.     Cervical back: Normal range of motion.  Skin:     General: Skin is warm and dry.     Coloration: Skin is not pale.     Findings: No erythema or rash.     Comments: No hematoma, ecchymosis, or abrasions to trunk, back.  Neurological:     Mental Status: He is alert and oriented to person, place, and time.     Coordination: Coordination normal.     Comments: GCS 15. Speech is clear, goal oriented. Moving all extremities spontaneously.  Psychiatric:        Speech: Speech is slurred.        Behavior: Behavior normal.      ED Results / Procedures / Treatments   Labs (all labs ordered are listed, but only abnormal results are displayed) Labs Reviewed  CBC WITH DIFFERENTIAL/PLATELET - Abnormal; Notable for the following components:      Result Value   RBC 3.85 (*)    MCV 105.2 (*)    MCH 35.3 (*)    All other components within normal limits  COMPREHENSIVE METABOLIC PANEL - Abnormal; Notable for the following components:   Glucose, Bld 104 (*)    Calcium 8.8 (*)    All other components within normal limits  ETHANOL - Abnormal; Notable for the following components:   Alcohol, Ethyl (B) 273 (*)    All other components within normal limits    EKG None  Radiology CT Head Wo Contrast  Result Date: 06/11/2023 CLINICAL DATA:  67 year old male status post unwitnessed fall. Right eye laceration, nasal deformity. EXAM: CT HEAD WITHOUT CONTRAST TECHNIQUE: Contiguous axial images were obtained from the base of the skull through the vertex without intravenous contrast. RADIATION DOSE REDUCTION: This exam was performed according to the departmental dose-optimization program which includes automated exposure control, adjustment of the mA and/or kV according to patient size and/or use of iterative reconstruction technique. COMPARISON:  Face and cervical spine CT today reported separately. Head CT 09/11/2005. FINDINGS: Brain: Cerebral volume loss since 2007 appears generalized. No midline shift, ventriculomegaly, mass effect, evidence of mass lesion,  intracranial hemorrhage or evidence of cortically based acute infarction. Patchy increased hypodensity in the bilateral basal ganglia since 2007, most conspicuous at the right lentiform but circumscribed and most likely chronic. Additional mild periventricular white matter hypodensity. Vascular: Calcified atherosclerosis at the skull base. No suspicious intracranial vascular hyperdensity. Skull: Nasal bone and right maxilla fractures, see face CT detailed separately. No calvarium fracture identified. Sinuses/Orbits: Nasal bone fractures, right maxilla fracture and abnormal right maxillary sinus aeration. See face CT details separately. Tympanic cavities and mastoids appear clear. Other: Right forehead, supraorbital scalp soft tissue laceration and deficiency series 4, image 17. Mild soft  tissue gas there. Underlying right frontal bone appears intact. Face CT reported separately. IMPRESSION: 1. Right forehead/supraorbital scalp soft tissue injury. No calvarium fracture. See Face CT reported separately. 2. No acute intracranial abnormality by CT. Chronic appearing white matter and right basal ganglia small vessel disease. Electronically Signed   By: Odessa Fleming M.D.   On: 06/11/2023 06:28   CT Cervical Spine Wo Contrast  Result Date: 06/11/2023 CLINICAL DATA:  67 year old male status post unwitnessed fall. Right eye laceration, nasal deformity. EXAM: CT CERVICAL SPINE WITHOUT CONTRAST TECHNIQUE: Multidetector CT imaging of the cervical spine was performed without intravenous contrast. Multiplanar CT image reconstructions were also generated. RADIATION DOSE REDUCTION: This exam was performed according to the departmental dose-optimization program which includes automated exposure control, adjustment of the mA and/or kV according to patient size and/or use of iterative reconstruction technique. COMPARISON:  CT head and face today. FINDINGS: Alignment: Straightening of cervical lordosis. Degenerative anterolisthesis of  C7 on T1 with associated chronic facet arthropathy. Similar degenerative anterolisthesis C3 on C4. Bilateral posterior element alignment is within normal limits. Skull base and vertebrae: Visualized skull base is intact. No atlanto-occipital dissociation. C1 and C2 appear intact and aligned. No acute osseous abnormality identified. Soft tissues and spinal canal: No prevertebral fluid or swelling. No visible canal hematoma. Negative visible noncontrast deep soft tissue spaces of the neck. Disc levels: Widespread cervical spine degeneration superimposed on degenerative appearing ankylosis of C2-C3. Spondylolisthesis with facet arthropathy of both C3-C4 and C7-T1. Advanced chronic disc and endplate degeneration C4-C5 through C6-C7. Up to mild associated spinal stenosis. Upper chest: Visible upper thoracic levels appear grossly intact. Some evidence of upper lung centrilobular emphysema. IMPRESSION: 1. No acute traumatic injury identified in the cervical spine. 2. 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 Multilevel degenerative spondylolisthesis. Electronically Signed   By: Odessa Fleming M.D.   On: 06/11/2023 05:34   CT Maxillofacial Wo Contrast  Result Date: 06/11/2023 CLINICAL DATA:  67 year old male status post unwitnessed fall. Right eye laceration, nasal deformity. EXAM: CT MAXILLOFACIAL WITHOUT CONTRAST TECHNIQUE: Multidetector CT imaging of the maxillofacial structures was performed. Multiplanar CT image reconstructions were also generated. RADIATION DOSE REDUCTION: This exam was performed according to the departmental dose-optimization program which includes automated exposure control, adjustment of the mA and/or kV according to patient size and/or use of iterative reconstruction technique. COMPARISON:  Head and cervical spine CT today reported separately. Previous head CT 09/11/2005. FINDINGS: Osseous: Mandible remains intact and normally located. Bilateral zygoma  and pterygoid plates remain intact. Comminuted and displaced bilateral nasal bone fractures, affecting the nasal process of the maxilla on the right side. New rightward septal deviation since 2007, and fractured bony nasal septum displaced to the right on series 8, image 49. Mild septal hematoma. The left maxilla appears intact.  Central skull base appears intact. Orbits: Left orbital walls appear intact. Left orbits soft tissues appear normal. There is a displaced left orbital floor fracture with broad-based bone fragment depressed about 3-4 mm on coronal series 10, image 35. Right lamina papyracea and other orbital walls remain intact. Small volume right intraorbital gas and right orbital floor hematoma/contusion (series 4, image 34). No herniated ocular muscle or fat. Sinuses: Small volume of hemorrhage in the right maxillary sinus, nasal cavity. Mild hemorrhage in the right anterior ethmoids. Frontal sinuses are hypoplastic. Other paranasal sinuses are well aerated. Visible tympanic cavities appear clear. Soft tissues: Superficial soft tissue injury at the nose including tracking soft tissue gas is seen  on series 2, image 60. Regional soft tissue swelling. Right premalar space hematoma/contusion is mild. Right supraorbital laceration and tracking soft tissue gas series 2, image 75. Negative visible noncontrast larynx, pharynx, parapharyngeal spaces, retropharyngeal space, sublingual space, submandibular spaces, masticator and parotid spaces. Limited intracranial: Stable to that reported separately. IMPRESSION: 1. Comminuted and displaced fractures of the: - bilateral nasal bones. - anterior nasal septum. - right maxilla nasal process. - right orbital floor. 2. Mild intraorbital contusion along the right orbital floor. Mild nasal septal hematoma. Small volume hemorrhage in the right maxillary sinus, nasal cavity, right anterior ethmoids. 3. Superficial soft tissue injury at the nose and above the right orbit with  soft tissue gas - OPEN nasal bone fractures not excluded. Electronically Signed   By: Odessa Fleming M.D.   On: 06/11/2023 05:31    Procedures .Marland KitchenLaceration Repair  Date/Time: 06/11/2023 6:16 AM  Performed by: Antony Madura, PA-C Authorized by: Antony Madura, PA-C   Consent:    Consent obtained:  Verbal and emergent situation   Consent given by:  Patient   Risks, benefits, and alternatives were discussed: yes     Risks discussed:  Need for additional repair, poor cosmetic result, poor wound healing and pain   Alternatives discussed:  No treatment Universal protocol:    Procedure explained and questions answered to patient or proxy's satisfaction: yes     Relevant documents present and verified: yes     Test results available: yes     Imaging studies available: yes     Required blood products, implants, devices, and special equipment available: yes     Site/side marked: yes     Immediately prior to procedure, a time out was called: yes     Patient identity confirmed:  Verbally with patient and arm band Anesthesia:    Anesthesia method:  Local infiltration   Local anesthetic:  Lidocaine 2% WITH epi Laceration details:    Location:  Face   Face location:  R eyebrow   Length (cm):  4.5 Pre-procedure details:    Preparation:  Patient was prepped and draped in usual sterile fashion Exploration:    Hemostasis achieved with:  Direct pressure Treatment:    Area cleansed with:  Povidone-iodine   Amount of cleaning:  Standard   Layers/structures repaired:  Deep subcutaneous Deep subcutaneous:    Suture size:  5-0   Suture material:  Vicryl   Suture technique:  Simple interrupted   Number of sutures:  1 Skin repair:    Repair method:  Sutures   Suture size:  5-0   Suture material:  Prolene   Suture technique:  Simple interrupted   Number of sutures:  6 Approximation:    Approximation:  Close Repair type:    Repair type:  Complex Post-procedure details:    Dressing:  Open (no  dressing)   Procedure completion:  Tolerated well, no immediate complications     Medications Ordered in ED Medications  ceFAZolin (ANCEF) IVPB 2g/100 mL premix (has no administration in time range)  Tdap (BOOSTRIX) injection 0.5 mL (0.5 mLs Intramuscular Given 06/11/23 0622)  lidocaine-EPINEPHrine (XYLOCAINE W/EPI) 2 %-1:200000 (PF) injection 10 mL (10 mLs Infiltration Given by Other 06/11/23 5284)    ED Course/ Medical Decision Making/ A&P Clinical Course as of 06/11/23 0631  Mon Jun 11, 2023  0443 No prior Tdap administration noted in our system [KH]  0620 Reassessed R eye. Pupils remain ERRL. Full EOMs of the R eye. No proptosis or hyphema noted.  No visual field defects.  [KH]  0630 Attempted call back to ENT trauma, MD Ross Marcus, with no answer. Message left previously by MD Madilyn Hook. [KH]    Clinical Course User Index [KH] Antony Madura, PA-C                                 Medical Decision Making Amount and/or Complexity of Data Reviewed Labs: ordered. Radiology: ordered.  Risk Prescription drug management.   This patient presents to the ED for concern of fall and facial injury, this involves an extensive number of treatment options, and is a complaint that carries with it a high risk of complications and morbidity.  The differential diagnosis includes facial laceration vs skull fx vs ICH vs concussion vs cervical fracture   Co morbidities that complicate the patient evaluation  HTN HLD Substance abuse   Additional history obtained:  Additional history obtained from EMS personnel   Lab Tests:  I Ordered, and personally interpreted labs.  The pertinent results include:  Ethanol 273.   Imaging Studies ordered:  I ordered imaging studies including CT head, maxillofacial and C-spine  I independently visualized and interpreted imaging which showed multiple comminuted and displaced facial fractures. No ICH, skull fx, or cervical vertebral injury. I agree with the  radiologist interpretation   Cardiac Monitoring:  The patient was maintained on a cardiac monitor.  I personally viewed and interpreted the cardiac monitored which showed an underlying rhythm of: sinus tachycardia   Medicines ordered and prescription drug management:  I ordered medication including Tdap for updated tetanus status and Ancef for coverage of potential open facial fx.  Reevaluation of the patient after these medicines showed that the patient  remained stable I have reviewed the patients home medicines and have made adjustments as needed   Test Considered:  UDS   Consultations Obtained:  I requested consultation with the Dr. Ross Marcus and discussed lab and imaging findings as well as pertinent plan - they recommend: f/u in clinic with course of Augmentin. Also recommended outpatient ophthalmology follow up; will provide referral.   Problem List / ED Course:  As above   Reevaluation:  After the interventions noted above, I reevaluated the patient and found that they have : remained stable   Dispostion:  After consideration of the diagnostic results and the patients response to treatment, I feel that the patent would benefit from suture removal in 1 week. Given instruction to f/u outpatient with ENT/trauma for management of facial fractures and to complete course of Augmentin. Provided referral to ophthalmology for evaluation of the patient's intraorbital contusion and orbital floor fracture. He denies any vision changes or loss at this time. There is no clinical concern for globe rupture. Return precautions discussed and provided. Patient discharged in stable condition with no unaddressed concerns. Brother-in-law called for ride home.         Final Clinical Impression(s) / ED Diagnoses Final diagnoses:  Fall, initial encounter  Alcoholic intoxication without complication (HCC)  Open fracture of nasal bone, initial encounter  Closed fracture of nasal  septum, initial encounter  Open fracture of right orbital floor, initial encounter (HCC)  Laceration of right eyebrow, initial encounter    Rx / DC Orders ED Discharge Orders          Ordered    amoxicillin-clavulanate (AUGMENTIN) 875-125 MG tablet  Every 12 hours        06/11/23 0647  Antony Madura, PA-C 06/24/23 0250    Tilden Fossa, MD 06/29/23 [phone]

## 2023-06-15 ENCOUNTER — Emergency Department (HOSPITAL_COMMUNITY)
Admission: EM | Admit: 2023-06-15 | Discharge: 2023-06-15 | Disposition: A | Discharge status: Home / Self Care | Payer: Medicare HMO

## 2023-06-15 ENCOUNTER — Emergency Department (HOSPITAL_COMMUNITY): Payer: Medicare HMO

## 2023-06-15 ENCOUNTER — Encounter (HOSPITAL_COMMUNITY): Payer: Self-pay

## 2023-06-15 ENCOUNTER — Other Ambulatory Visit: Payer: Self-pay

## 2023-06-15 DIAGNOSIS — S0181XA Laceration without foreign body of other part of head, initial encounter: Secondary | ICD-10-CM | POA: Insufficient documentation

## 2023-06-15 DIAGNOSIS — W19XXXA Unspecified fall, initial encounter: Secondary | ICD-10-CM | POA: Insufficient documentation

## 2023-06-15 DIAGNOSIS — S0231XA Fracture of orbital floor, right side, initial encounter for closed fracture: Secondary | ICD-10-CM

## 2023-06-15 DIAGNOSIS — F17210 Nicotine dependence, cigarettes, uncomplicated: Secondary | ICD-10-CM | POA: Diagnosis not present

## 2023-06-15 DIAGNOSIS — I10 Essential (primary) hypertension: Secondary | ICD-10-CM | POA: Insufficient documentation

## 2023-06-15 DIAGNOSIS — M25562 Pain in left knee: Secondary | ICD-10-CM | POA: Insufficient documentation

## 2023-06-15 DIAGNOSIS — S0993XA Unspecified injury of face, initial encounter: Secondary | ICD-10-CM | POA: Diagnosis present

## 2023-06-15 DIAGNOSIS — G8929 Other chronic pain: Secondary | ICD-10-CM | POA: Insufficient documentation

## 2023-06-15 DIAGNOSIS — M25561 Pain in right knee: Secondary | ICD-10-CM | POA: Diagnosis not present

## 2023-06-15 DIAGNOSIS — Z79899 Other long term (current) drug therapy: Secondary | ICD-10-CM | POA: Insufficient documentation

## 2023-06-15 DIAGNOSIS — S022XXB Fracture of nasal bones, initial encounter for open fracture: Secondary | ICD-10-CM

## 2023-06-15 LAB — CBC WITH DIFFERENTIAL/PLATELET
Abs Immature Granulocytes: 0.02 10*3/uL (ref 0.00–0.07)
Basophils Absolute: 0 10*3/uL (ref 0.0–0.1)
Basophils Relative: 1 %
Eosinophils Absolute: 0.4 10*3/uL (ref 0.0–0.5)
Eosinophils Relative: 6 %
HCT: 35.4 % — ABNORMAL LOW (ref 39.0–52.0)
Hemoglobin: 12 g/dL — ABNORMAL LOW (ref 13.0–17.0)
Immature Granulocytes: 0 %
Lymphocytes Relative: 27 %
Lymphs Abs: 1.7 10*3/uL (ref 0.7–4.0)
MCH: 35.2 pg — ABNORMAL HIGH (ref 26.0–34.0)
MCHC: 33.9 g/dL (ref 30.0–36.0)
MCV: 103.8 fL — ABNORMAL HIGH (ref 80.0–100.0)
Monocytes Absolute: 0.5 10*3/uL (ref 0.1–1.0)
Monocytes Relative: 8 %
Neutro Abs: 3.8 10*3/uL (ref 1.7–7.7)
Neutrophils Relative %: 58 %
Platelets: 232 10*3/uL (ref 150–400)
RBC: 3.41 MIL/uL — ABNORMAL LOW (ref 4.22–5.81)
RDW: 13.6 % (ref 11.5–15.5)
WBC: 6.4 10*3/uL (ref 4.0–10.5)
nRBC: 0 % (ref 0.0–0.2)

## 2023-06-15 LAB — RAPID URINE DRUG SCREEN, HOSP PERFORMED
Amphetamines: NOT DETECTED
Barbiturates: NOT DETECTED
Benzodiazepines: POSITIVE — AB
Cocaine: NOT DETECTED
Opiates: NOT DETECTED
Tetrahydrocannabinol: NOT DETECTED

## 2023-06-15 LAB — BASIC METABOLIC PANEL
Anion gap: 12 (ref 5–15)
BUN: 36 mg/dL — ABNORMAL HIGH (ref 8–23)
CO2: 23 mmol/L (ref 22–32)
Calcium: 9.1 mg/dL (ref 8.9–10.3)
Chloride: 103 mmol/L (ref 98–111)
Creatinine, Ser: 1.25 mg/dL — ABNORMAL HIGH (ref 0.61–1.24)
GFR, Estimated: >60 mL/min (ref >60)
Glucose, Bld: 79 mg/dL (ref 70–99)
Potassium: 4.2 mmol/L (ref 3.5–5.1)
Sodium: 138 mmol/L (ref 135–145)

## 2023-06-15 LAB — ETHANOL: Alcohol, Ethyl (B): <10 mg/dL (ref <10)

## 2023-06-15 MED ORDER — LIDOCAINE-EPINEPHRINE (PF) 2 %-1:200000 IJ SOLN
20.0000 mL | Freq: Once | INTRAMUSCULAR | Status: AC
  Administered 2023-06-15: 20 mL
  Filled 2023-06-15: qty 20
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
## 2023-06-15 NOTE — ED Triage Notes (Addendum)
Pt to ED by EMS from [facility] following a mechanical fall. Pt arrives with laceration to the left side of his forehead, bleeding is controlled at this time. Pt has other facial lacerations and bruising from previous falls. Arrives with C-collar in place and pressure dressing on wound. Pt endorses drinking "a few" alcoholic beverages today. VSS, NADN. Denies any pain at this time. No LOC no thinners.

## 2023-06-15 NOTE — Discharge Instructions (Addendum)
You have been seen today for your complaint of fall, facial laceration and contusions. Your imaging showed no acute fractures but redemonstrates fractures of your right eye socket and nose. Home care instructions are as follows:  Keep the lacerations clean and dry.  Clean with warm soapy water once daily.  The sutures in your left forehead are absorbable and do not need to be removed Follow up with: Dr. Suszanne Conners.  He is an ear, nose and throat doctor.  Call to schedule appointment for follow-up visit regarding your nasal fracture Dr. Vonna Kotyk.  He is an ophthalmologist.  Call to schedule an appointment regarding your eye socket fracture Please seek immediate medical care if you develop any of the following symptoms: You have very bad swelling around the wound. Your pain suddenly gets worse and is very bad. You have painful lumps near the wound or on skin anywhere on your body. You have a red streak going away from your wound. The wound is on your hand or foot, and: You cannot move a finger or toe. Your fingers or toes look pale or bluish. At this time there does not appear to be the presence of an emergent medical condition, however there is always the potential for conditions to change. Please read and follow the below instructions.  Do not take your medicine if  develop an itchy rash, swelling in your mouth or lips, or difficulty breathing; call 911 and seek immediate emergency medical attention if this occurs.  You may review your lab tests and imaging results in their entirety on your MyChart account.  Please discuss all results of fully with your primary care provider and other specialist at your follow-up visit.  Note: Portions of this text may have been transcribed using voice recognition software. Every effort was made to ensure accuracy; however, inadvertent computerized transcription errors may still be present.

## 2023-06-18 ENCOUNTER — Ambulatory Visit (INDEPENDENT_AMBULATORY_CARE_PROVIDER_SITE_OTHER): Payer: Medicare HMO

## 2023-06-18 ENCOUNTER — Telehealth (INDEPENDENT_AMBULATORY_CARE_PROVIDER_SITE_OTHER): Payer: Self-pay | Admitting: Otolaryngology

## 2023-06-18 ENCOUNTER — Encounter (INDEPENDENT_AMBULATORY_CARE_PROVIDER_SITE_OTHER): Payer: Self-pay

## 2023-06-18 VITALS — Ht 64.0 in | Wt 151.0 lb

## 2023-06-18 DIAGNOSIS — S0231XA Fracture of orbital floor, right side, initial encounter for closed fracture: Secondary | ICD-10-CM

## 2023-06-18 DIAGNOSIS — R0981 Nasal congestion: Secondary | ICD-10-CM | POA: Diagnosis not present

## 2023-06-18 DIAGNOSIS — J342 Deviated nasal septum: Secondary | ICD-10-CM

## 2023-06-18 DIAGNOSIS — S022XXA Fracture of nasal bones, initial encounter for closed fracture: Secondary | ICD-10-CM

## 2023-06-18 DIAGNOSIS — J343 Hypertrophy of nasal turbinates: Secondary | ICD-10-CM | POA: Diagnosis not present

## 2023-06-18 DIAGNOSIS — J3489 Other specified disorders of nose and nasal sinuses: Secondary | ICD-10-CM | POA: Diagnosis not present

## 2023-06-18 DIAGNOSIS — J310 Chronic rhinitis: Secondary | ICD-10-CM

## 2023-06-18 NOTE — Telephone Encounter (Signed)
Called patient to schedule appt; ED Follow up for closed fracture of Right Orbital floor, Open fracture of nasal bone; ED visits 06/11/23 & 06/15/23;  patient was given address

## 2023-06-19 DIAGNOSIS — J342 Deviated nasal septum: Secondary | ICD-10-CM | POA: Insufficient documentation

## 2023-06-19 DIAGNOSIS — J3489 Other specified disorders of nose and nasal sinuses: Secondary | ICD-10-CM | POA: Insufficient documentation

## 2023-06-19 DIAGNOSIS — J343 Hypertrophy of nasal turbinates: Secondary | ICD-10-CM | POA: Insufficient documentation

## 2023-06-19 DIAGNOSIS — S022XXA Fracture of nasal bones, initial encounter for closed fracture: Secondary | ICD-10-CM | POA: Insufficient documentation
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
## 2023-06-21 ENCOUNTER — Encounter (HOSPITAL_COMMUNITY): Payer: Self-pay | Admitting: Otolaryngology

## 2023-06-21 NOTE — Anesthesia Preprocedure Evaluation (Addendum)
Anesthesia Evaluation  Patient identified by MRN, date of birth, ID band Patient awake    Reviewed: Allergy & Precautions, H&P , NPO status , Patient's Chart, lab work & pertinent test results, reviewed documented beta blocker date and time   Airway Mallampati: II  TM Distance: >3 FB Neck ROM: Full    Dental no notable dental hx. (+) Teeth Intact, Dental Advisory Given   Pulmonary Current Smoker   Pulmonary exam normal breath sounds clear to auscultation       Cardiovascular hypertension, Pt. on medications and Pt. on home beta blockers  Rhythm:Regular Rate:Normal     Neuro/Psych   Anxiety     negative neurological ROS     GI/Hepatic Neg liver ROS, hiatal hernia,,,  Endo/Other  negative endocrine ROS    Renal/GU negative Renal ROS  negative genitourinary   Musculoskeletal  (+) Arthritis , Osteoarthritis,    Abdominal   Peds  Hematology negative hematology ROS (+)   Anesthesia Other Findings   Reproductive/Obstetrics negative OB ROS                             Anesthesia Physical Anesthesia Plan  ASA: 3  Anesthesia Plan: General   Post-op Pain Management: Ofirmev IV (intra-op)*   Induction: Intravenous  PONV Risk Score and Plan: 2 and Ondansetron and Dexamethasone  Airway Management Planned: Oral ETT  Additional Equipment:   Intra-op Plan:   Post-operative Plan: Extubation in OR  Informed Consent: I have reviewed the patients History and Physical, chart, labs and discussed the procedure including the risks, benefits and alternatives for the proposed anesthesia with the patient or authorized representative who has indicated his/her understanding and acceptance.     Dental advisory given  Plan Discussed with: CRNA  Anesthesia Plan Comments: (PAT note written 06/21/2023 by Shonna Chock, PA-C.  )       Anesthesia Quick Evaluation
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
## 2023-06-21 NOTE — Progress Notes (Signed)
SDW call  Patient was given pre-op instructions over the phone. Patient verbalized understanding of instructions provided.     PCP -  Dr. Jessy Oto Cardiologist -  Pulmonary:    PPM/ICD - denies Device Orders - na Rep Notified - na   Chest x-ray - na EKG -  DOS, 06/22/2023 Stress Test - ECHO -  Cardiac Cath -   Sleep Study/sleep apnea/CPAP: denies  Non-diabetic  Blood Thinner Instructions: denies Aspirin Instructions:denies   ERAS Protcol - Clears until 1030   COVID TEST- na    Anesthesia review: Yes. HTN, ETOH abuse, high cholsterol,    Patient denies shortness of breath, fever, cough and chest pain over the phone call  Your procedure is scheduled on Friday June 22, 2023  Report to [facility] Main Entrance "A" at  1100  A.M., then check in with the Admitting office.  Call this number if you have problems the morning of surgery:  [phone]   If you have any questions prior to your surgery date call [phone]: Open Monday-Friday 8am-4pm If you experience any cold or flu symptoms such as cough, fever, chills, shortness of breath, etc. between now and your scheduled surgery, please notify us at the above number     Remember:  Do not eat after midnight the night before your surgery  You may drink clear liquids until  1030   the morning of your surgery.   Clear liquids allowed are: Water, Non-Citrus Juices (without pulp), Carbonated Beverages, Clear Tea, Black Coffee ONLY (NO MILK, CREAM OR POWDERED CREAMER of any kind), and Gatorade   Take these medicines the morning of surgery with A SIP OF WATER:  Bisoprolol-hydrochlorothiazide, flexeril, crestor  As needed: Valium, gabapentin, percocet  As of today, STOP taking any Aspirin (unless otherwise instructed by your surgeon) Aleve, Naproxen, Ibuprofen, Motrin, Advil, Goody's, BC's, all herbal medications, fish oil, and all vitamins.  This includes your celebrex.

## 2023-06-22 ENCOUNTER — Encounter (HOSPITAL_COMMUNITY)
Admission: RE | Disposition: A | Discharge status: Home / Self Care | Payer: Self-pay | Source: Home / Self Care | Attending: Otolaryngology

## 2023-06-22 ENCOUNTER — Ambulatory Visit (HOSPITAL_COMMUNITY): Payer: Medicare HMO | Admitting: Vascular Surgery

## 2023-06-22 ENCOUNTER — Other Ambulatory Visit: Payer: Self-pay

## 2023-06-22 ENCOUNTER — Encounter (HOSPITAL_COMMUNITY): Payer: Self-pay | Admitting: Otolaryngology

## 2023-06-22 ENCOUNTER — Ambulatory Visit (HOSPITAL_COMMUNITY)
Admission: RE | Admit: 2023-06-22 | Discharge: 2023-06-22 | Disposition: A | Discharge status: Home / Self Care | Payer: Medicare HMO | Attending: Otolaryngology | Admitting: Otolaryngology

## 2023-06-22 DIAGNOSIS — K449 Diaphragmatic hernia without obstruction or gangrene: Secondary | ICD-10-CM | POA: Insufficient documentation

## 2023-06-22 DIAGNOSIS — M1990 Unspecified osteoarthritis, unspecified site: Secondary | ICD-10-CM | POA: Diagnosis not present

## 2023-06-22 DIAGNOSIS — I700 Atherosclerosis of aorta: Secondary | ICD-10-CM | POA: Insufficient documentation

## 2023-06-22 DIAGNOSIS — I444 Left anterior fascicular block: Secondary | ICD-10-CM | POA: Insufficient documentation

## 2023-06-22 DIAGNOSIS — S022XXA Fracture of nasal bones, initial encounter for closed fracture: Secondary | ICD-10-CM

## 2023-06-22 DIAGNOSIS — J439 Emphysema, unspecified: Secondary | ICD-10-CM | POA: Diagnosis not present

## 2023-06-22 DIAGNOSIS — J310 Chronic rhinitis: Secondary | ICD-10-CM | POA: Insufficient documentation

## 2023-06-22 DIAGNOSIS — W19XXXA Unspecified fall, initial encounter: Secondary | ICD-10-CM | POA: Insufficient documentation

## 2023-06-22 DIAGNOSIS — S022XXD Fracture of nasal bones, subsequent encounter for fracture with routine healing: Secondary | ICD-10-CM | POA: Diagnosis not present

## 2023-06-22 DIAGNOSIS — F419 Anxiety disorder, unspecified: Secondary | ICD-10-CM | POA: Insufficient documentation

## 2023-06-22 DIAGNOSIS — F17210 Nicotine dependence, cigarettes, uncomplicated: Secondary | ICD-10-CM | POA: Diagnosis not present

## 2023-06-22 DIAGNOSIS — J343 Hypertrophy of nasal turbinates: Secondary | ICD-10-CM | POA: Diagnosis not present

## 2023-06-22 DIAGNOSIS — I10 Essential (primary) hypertension: Secondary | ICD-10-CM | POA: Diagnosis not present

## 2023-06-22 HISTORY — PX: CLOSED REDUCTION NASAL FRACTURE: SHX5365

## 2023-06-22 SURGERY — CLOSED REDUCTION, FRACTURE, NASAL BONE
Anesthesia: General | Site: Nose

## 2023-06-22 MED ORDER — HYDROMORPHONE HCL 1 MG/ML IJ SOLN: 0.2500 mg | INTRAMUSCULAR | Status: DC | PRN

## 2023-06-22 MED ORDER — SUCCINYLCHOLINE CHLORIDE 200 MG/10ML IV SOSY
PREFILLED_SYRINGE | INTRAVENOUS | Status: DC | PRN
  Administered 2023-06-22: 80 mg via INTRAVENOUS

## 2023-06-22 MED ORDER — ORAL CARE MOUTH RINSE: 15.0000 mL | Freq: Once | OROMUCOSAL | Status: AC

## 2023-06-22 MED ORDER — ONDANSETRON HCL 4 MG/2ML IJ SOLN
INTRAMUSCULAR | Status: AC
  Filled 2023-06-22: qty 2

## 2023-06-22 MED ORDER — LACTATED RINGERS IV SOLN: INTRAVENOUS | Status: DC

## 2023-06-22 MED ORDER — PHENYLEPHRINE 80 MCG/ML (10ML) SYRINGE FOR IV PUSH (FOR BLOOD PRESSURE SUPPORT)
PREFILLED_SYRINGE | INTRAVENOUS | Status: DC | PRN
  Administered 2023-06-22 (×2): 80 ug via INTRAVENOUS

## 2023-06-22 MED ORDER — SODIUM CHLORIDE (PF) 0.9 % IJ SOLN
INTRAMUSCULAR | Status: AC
Start: 2023-06-22 — End: ?
  Filled 2023-06-22: qty 20

## 2023-06-22 MED ORDER — CEFAZOLIN SODIUM-DEXTROSE 2-3 GM-%(50ML) IV SOLR
INTRAVENOUS | Status: DC | PRN
  Administered 2023-06-22: 2 g via INTRAVENOUS

## 2023-06-22 MED ORDER — DEXAMETHASONE SODIUM PHOSPHATE 10 MG/ML IJ SOLN
INTRAMUSCULAR | Status: DC | PRN
  Administered 2023-06-22: 8 mg via INTRAVENOUS

## 2023-06-22 MED ORDER — SUCCINYLCHOLINE CHLORIDE 200 MG/10ML IV SOSY
PREFILLED_SYRINGE | INTRAVENOUS | Status: AC
  Filled 2023-06-22: qty 10

## 2023-06-22 MED ORDER — LIDOCAINE 2% (20 MG/ML) 5 ML SYRINGE
INTRAMUSCULAR | Status: AC
  Filled 2023-06-22: qty 10

## 2023-06-22 MED ORDER — OXYMETAZOLINE HCL 0.05 % NA SOLN
NASAL | Status: AC
  Filled 2023-06-22: qty 30

## 2023-06-22 MED ORDER — CHLORHEXIDINE GLUCONATE 0.12 % MT SOLN: 15.0000 mL | Freq: Once | OROMUCOSAL | Status: AC

## 2023-06-22 MED ORDER — PROPOFOL 10 MG/ML IV BOLUS
INTRAVENOUS | Status: AC
  Filled 2023-06-22: qty 20

## 2023-06-22 MED ORDER — DEXAMETHASONE SODIUM PHOSPHATE 10 MG/ML IJ SOLN
INTRAMUSCULAR | Status: AC
  Filled 2023-06-22: qty 1

## 2023-06-22 MED ORDER — MIDAZOLAM HCL 2 MG/2ML IJ SOLN
INTRAMUSCULAR | Status: AC
  Filled 2023-06-22: qty 2

## 2023-06-22 MED ORDER — FENTANYL CITRATE (PF) 250 MCG/5ML IJ SOLN
INTRAMUSCULAR | Status: DC | PRN
  Administered 2023-06-22: 50 ug via INTRAVENOUS

## 2023-06-22 MED ORDER — OXYMETAZOLINE HCL 0.05 % NA SOLN
NASAL | Status: DC | PRN
  Administered 2023-06-22: 1

## 2023-06-22 MED ORDER — CHLORHEXIDINE GLUCONATE 0.12 % MT SOLN
OROMUCOSAL | Status: AC
  Administered 2023-06-22: 15 mL via OROMUCOSAL
  Filled 2023-06-22: qty 15

## 2023-06-22 MED ORDER — PROPOFOL 10 MG/ML IV BOLUS
INTRAVENOUS | Status: DC | PRN
  Administered 2023-06-22: 120 mg via INTRAVENOUS

## 2023-06-22 MED ORDER — ONDANSETRON HCL 4 MG/2ML IJ SOLN
INTRAMUSCULAR | Status: DC | PRN
  Administered 2023-06-22: 4 mg via INTRAVENOUS

## 2023-06-22 MED ORDER — FENTANYL CITRATE (PF) 250 MCG/5ML IJ SOLN
INTRAMUSCULAR | Status: AC
  Filled 2023-06-22: qty 5

## 2023-06-22 MED ORDER — LIDOCAINE 2% (20 MG/ML) 5 ML SYRINGE
INTRAMUSCULAR | Status: DC | PRN
  Administered 2023-06-22: 60 mg via INTRAVENOUS

## 2023-06-22 SURGICAL SUPPLY — 27 items
AQUAPLAST 3X3 FLAT (MISCELLANEOUS) ×1 IMPLANT
BAG COUNTER SPONGE SURGICOUNT (BAG) ×1 IMPLANT
BENZOIN TINCTURE PRP APPL 2/3 (GAUZE/BANDAGES/DRESSINGS) ×1 IMPLANT
BLADE SURG 15 STRL LF DISP TIS (BLADE) IMPLANT
CANISTER SUCT 3000ML PPV (MISCELLANEOUS) ×1 IMPLANT
COVER BACK TABLE 60X90IN (DRAPES) ×1 IMPLANT
COVER MAYO STAND STRL (DRAPES) ×1 IMPLANT
DRAPE HALF SHEET 40X57 (DRAPES) IMPLANT
GAUZE 4X4 16PLY ~~LOC~~+RFID DBL (SPONGE) ×1 IMPLANT
GAUZE SPONGE 2X2 8PLY STRL LF (GAUZE/BANDAGES/DRESSINGS) IMPLANT
GLOVE ECLIPSE 7.5 STRL STRAW (GLOVE) ×1 IMPLANT
KIT BASIN OR (CUSTOM PROCEDURE TRAY) ×1 IMPLANT
KIT SPLINT NASAL DENVER LRG BE (GAUZE/BANDAGES/DRESSINGS) IMPLANT
KIT SPLINT NASAL DENVER MIN BE (GAUZE/BANDAGES/DRESSINGS) IMPLANT
KIT SPLINT NASAL DENVER PET BE (GAUZE/BANDAGES/DRESSINGS) IMPLANT
KIT SPLINT NASAL DENVER SM BEI (GAUZE/BANDAGES/DRESSINGS) IMPLANT
KIT TURNOVER KIT B (KITS) ×1 IMPLANT
NDL HYPO 25GX1X1/2 BEV (NEEDLE) IMPLANT
NEEDLE HYPO 25GX1X1/2 BEV (NEEDLE) IMPLANT
NS IRRIG 1000ML POUR BTL (IV SOLUTION) IMPLANT
PAD ARMBOARD 7.5X6 YLW CONV (MISCELLANEOUS) ×2 IMPLANT
SPLINT AQUAPLAST 3X3 FLAT (MISCELLANEOUS) IMPLANT
SPONGE NEURO XRAY DETECT 1X3 (DISPOSABLE) ×1 IMPLANT
STRIP CLOSURE SKIN 1/2X4 (GAUZE/BANDAGES/DRESSINGS) IMPLANT
TOWEL GREEN STERILE FF (TOWEL DISPOSABLE) ×2 IMPLANT
TUBE CONNECTING 12X1/4 (SUCTIONS) ×1 IMPLANT
WATER STERILE IRR 1000ML POUR (IV SOLUTION) IMPLANT

## 2023-06-22 NOTE — Anesthesia Procedure Notes (Signed)
Procedure Name: Intubation Date/Time: 06/22/2023 1:45 PM  Performed by: Little Ishikawa, CRNAPre-anesthesia Checklist: Patient identified, Emergency Drugs available, Suction available, Timeout performed and Patient being monitored Patient Re-evaluated:Patient Re-evaluated prior to induction Oxygen Delivery Method: Circle system utilized Preoxygenation: Pre-oxygenation with 100% oxygen Induction Type: IV induction Ventilation: Mask ventilation without difficulty Laryngoscope Size: Ferrall and 2 Grade View: Grade I Tube type: Oral Number of attempts: 1 Airway Equipment and Method: Stylet Placement Confirmation: ETT inserted through vocal cords under direct vision, positive ETCO2, CO2 detector and breath sounds checked- equal and bilateral Secured at: 21 cm Tube secured with: Tape Dental Injury: Teeth and Oropharynx as per pre-operative assessment  Comments: Inserted by Hassel Neth

## 2023-06-22 NOTE — Op Note (Signed)
DATE OF PROCEDURE: 06/22/2023  OPERATIVE REPORT    SURGEON:  Newman Pies, MD   PREOPERATIVE DIAGNOSIS:  Displaced nasal fractures   POSTOPERATIVE DIAGNOSIS:  Displaced nasal fractures   PROCEDURES PERFORMED:  Closed reduction of nasal fractures with stabilization   ANESTHESIA:  General laryngeal mask anesthesia.   COMPLICATIONS:  None.   ESTIMATED BLOOD LOSS:  Minimal.   INDICATION FOR PROCEDURE:   [name] is a 67 y.o. male who recently sustained a nasal trauma, resulting in displaced nasal fractures. On examination, the patient was noted to have deviated nasal dorsum. Based on the physical findings, the decision was made for the patient to undergo the above-stated procedure.  The risks, benefits, alternatives, and details of the procedures were discussed with the patient.  Questions were invited and answered.  Informed consent was obtained.   DESCRIPTION OF PROCEDURE:  The patient was taken to the operating room and placed supine on the operating table.  General ETT anesthesia was induced by the anesthesiologist.    Pledgets soaked with Afrin were placed in both nasal cavities for vasoconstriction. The pledgets were subsequently removed. Examination of the nose revealed a noticeable nasal dorsal deviation to the left. Using a nasal bone elevator, the depressed right nasal bone was elevated. Manual pressure was applied to the contralateral nasal bone to achieve reduction of the deviated nasal fractures.  Good reduction of the nasal fractures was achieved. A [place] splint was applied to the nasal dorsum. That concluded the procedure for the patient.    The care of the patient was turned over to the anesthesiologist.  The patient was awakened from anesthesia without difficulty.  He was extubated and transferred to the recovery room in good condition.   OPERATIVE FINDINGS: Nasal fractures with nasal dorsal deviation.   SPECIMEN:  None.   FOLLOWUP CARE:  The patient will be discharged  home once he is awake and alert.  He will follow up in my office in 1 week.
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
## 2023-06-22 NOTE — Transfer of Care (Signed)
Immediate Anesthesia Transfer of Care Note  Patient: [name]  Procedure(s) Performed: CLOSED REDUCTION NASAL FRACTURE (Nose)  Patient Location: PACU  Anesthesia Type:General  Level of Consciousness: drowsy  Airway & Oxygen Therapy: Patient Spontanous Breathing and Patient connected to face mask oxygen  Post-op Assessment: Report given to RN and Post -op Vital signs reviewed and stable  Post vital signs: Reviewed and stable  Last Vitals:  Vitals Value Taken Time  BP 105/72 06/22/23 1409  Temp    Pulse 71 06/22/23 1411  Resp 11 06/22/23 1411  SpO2 100 % 06/22/23 1411  Vitals shown include unfiled device data.  Last Pain:  Vitals:   06/22/23 1124  PainSc: 0-No pain         Complications: No notable events documented.

## 2023-06-22 NOTE — Discharge Instructions (Addendum)
 POSTOPERATIVE INSTRUCTIONS FOR PATIENTS HAVING RHINOPLASTY OR CLOSED REDUCTION OF NASAL FRACTURES ACTIVITY: Restrict activity at home for the first two days, resting as much as possible. Light activity is best. You may usually return to work within a week. You should refrain from nose blowing, strenuous activity, or heavy lifting greater than 20lbs for a total of three weeks after your operation.  If sneezing cannot be avoided, sneeze with your mouth open. DISCOMFORT: You may experience a dull headache and pressure along with nasal congestion and discharge. These symptoms may be worse during the first week after the operation but may last as long as two to four weeks.  Please take Tylenol or the pain medication that has been prescribed for you. Do not take aspirin or aspirin containing medications since they may cause bleeding.  You may experience symptoms of post nasal drainage, nasal congestion, headaches and fatigue for two or three months after your operation. You may have swelling of your eyelids for up to five to seven days and some discoloration may persist for a longer period of time. BLEEDING: You may have some blood tinged nasal drainage for approximately two weeks after the operation.  The discharge will be worse for the first week.  Please call our office at [phone] or go to the nearest hospital emergency room if you experience any of the following: heavy, bright red blood from your nose or mouth that lasts longer than ten minutes or coughing up or vomiting bright red blood or blood clots. GENERAL CONSIDERATIONS: A gauze dressing will be placed on your upper lip to absorb any drainage after the operation. You may need to change this several times a day.  If you do not have very much drainage, you may remove the dressing.  Remember that you may gently wipe your nose with a tissue and sniff in, but DO NOT blow your nose. Please keep all of your postoperative appointments.  Your final results  after the operation will depend on proper follow-up.  The initial visit is usually four to seven days after the operation.  During this visit, the remaining nasal packing and internal septal splints will be removed.  Your nasal and sinus cavities will be cleaned.  During the second visit, your nasal and sinus cavities will be cleaned again. Have someone drive you to your first two postoperative appointments. We suggest that you take your prescribed pain medication about  hour prior to each of these two appointments.  How you care for your nose after the operation will influence the results that you obtain.  You should follow all directions, take your medication as prescribed, and call our office [phone] with any problems or questions. If a cast or stent is worn over the nose, the following precautions should be taken: Keep cast or stent dry. If a gauze dressing is worn, do not tape it to the cast or stent. Do not remove or add tape to the cast or stent. Do not replace the stent or cast if it comes off. Call the office if this should occur. Do not scratch or touch under the cast or stent. Cleanse the outside of your nose gently with soap and water after the cast/stent is removed. You may be more comfortable sleeping with your head elevated on two pillows. Do not take any medications that we have not prescribed or recommended.  WARNING SIGNS: if any of the following should occur, please call our office: Bright red bleeding which lasts more than 10 minutes.  Persistent fever greater than 102F. Persistent vomiting. Severe and constant pain that is not relieved by prescribed pain medication. Trauma to the nose. If your cast becomes displaced prior to time of next clinic visit. Rash or unusual side effects from any medicines.

## 2023-06-25 ENCOUNTER — Encounter (HOSPITAL_COMMUNITY): Payer: Self-pay | Admitting: Otolaryngology

## 2023-06-25 NOTE — Anesthesia Postprocedure Evaluation (Signed)
Anesthesia Post Note  Patient: [name]  Procedure(s) Performed: CLOSED REDUCTION NASAL FRACTURE (Nose)     Patient location during evaluation: PACU Anesthesia Type: General Level of consciousness: awake Pain management: pain level controlled Vital Signs Assessment: post-procedure vital signs reviewed and stable Respiratory status: spontaneous breathing, nonlabored ventilation and respiratory function stable Cardiovascular status: blood pressure returned to baseline and stable Postop Assessment: no apparent nausea or vomiting Anesthetic complications: no   No notable events documented.  Last Vitals:  Vitals:   06/22/23 1445 06/22/23 1500  BP: 106/63 114/71  Pulse: 67 71  Resp: 10 19  Temp:  36.6 C  SpO2: 93% 95%    Last Pain:  Vitals:   06/22/23 1500  PainSc: 0-No pain                 [name]

## 2023-06-29 ENCOUNTER — Ambulatory Visit (INDEPENDENT_AMBULATORY_CARE_PROVIDER_SITE_OTHER): Payer: Medicare HMO

## 2023-06-29 ENCOUNTER — Encounter (INDEPENDENT_AMBULATORY_CARE_PROVIDER_SITE_OTHER): Payer: Self-pay

## 2023-06-29 VITALS — Ht 64.0 in | Wt 153.0 lb

## 2023-06-29 DIAGNOSIS — F1991 Other psychoactive substance use, unspecified, in remission: Secondary | ICD-10-CM | POA: Diagnosis not present

## 2023-06-29 DIAGNOSIS — S022XXA Fracture of nasal bones, initial encounter for closed fracture: Secondary | ICD-10-CM | POA: Diagnosis not present

## 2023-06-29 DIAGNOSIS — J3489 Other specified disorders of nose and nasal sinuses: Secondary | ICD-10-CM | POA: Diagnosis not present

## 2023-06-30 NOTE — Progress Notes (Signed)
Patient ID: [name], male   DOB: 11/11/1955, 67 y.o.   MRN: [id number]  Follow-up: Displaced nasal fractures, nasal septal perforation  HPI: The patient is a 67 year old male who returns today for his follow-up evaluation.  The patient was previously seen for his displaced nasal septal fractures.  He was also noted to have a large septal perforation, secondary to previous cocaine use.  He was treated with closed reduction of his nasal septal fractures with stabilization.  The patient returns today reporting no difficulties since her surgery.  His nasal breathing has improved.  He has no other complaint.  Exam: General: Communicates without difficulty, well nourished, no acute distress. Head: Normocephalic, no evidence injury, no tenderness, facial buttresses intact without stepoff. Face/sinus: Bilateral facial ecchymosis and edema.  Facial movement is normal and symmetric. Eyes: PERRL, EOMI. No scleral icterus, conjunctivae clear.  No nystagmus at any point of gaze. Ears: Auricles well formed without lesions.  Ear canals are intact without mass or lesion.  No erythema or edema is appreciated.  The TMs are intact without fluid. Nose: Nasal dorsum is midline. Anterior rhinoscopy reveals congested mucosa over anterior aspect of inferior turbinates and septum.  A large septal perforation is noted.  No bleeding noted. Oral:  Oral cavity and oropharynx are intact, symmetric, without erythema or edema.  Mucosa is moist without lesions. Neck: Full range of motion without pain.  There is no significant lymphadenopathy.  No masses palpable.  Thyroid bed within normal limits to palpation.  Parotid glands and submandibular glands equal bilaterally without mass.  Trachea is midline. Neuro:  CN 2-12 grossly intact.    Assessment: 1.  The patient is noted to have good reduction of his displaced nasal fractures. 2.  Nasal septal perforation, secondary to his previous cocaine use.  Plan: 1.  The physical exam  findings are reviewed with the patient. 2.  Nasal ointments, nasal saline irrigation as needed to treat any nasal dryness. 3.  The patient is encouraged to call with any questions or concerns.

## 2023-07-09 NOTE — Progress Notes (Signed)
Ethanol ordered to gauge degree of intoxication for decision making capacity and C-spine clearance given acute facial/head trauma.

## 2023-08-17 DIAGNOSIS — F17210 Nicotine dependence, cigarettes, uncomplicated: Secondary | ICD-10-CM | POA: Insufficient documentation

## 2023-08-17 DIAGNOSIS — I10 Essential (primary) hypertension: Secondary | ICD-10-CM | POA: Insufficient documentation

## 2023-08-17 DIAGNOSIS — R079 Chest pain, unspecified: Secondary | ICD-10-CM | POA: Insufficient documentation

## 2023-08-17 DIAGNOSIS — Z8546 Personal history of malignant neoplasm of prostate: Secondary | ICD-10-CM | POA: Insufficient documentation

## 2023-08-18 ENCOUNTER — Other Ambulatory Visit: Payer: Self-pay

## 2023-08-18 ENCOUNTER — Emergency Department (HOSPITAL_COMMUNITY)
Admission: EM | Admit: 2023-08-18 | Discharge: 2023-08-18 | Disposition: A | Discharge status: Home / Self Care | Payer: Medicare HMO | Attending: Emergency Medicine | Admitting: Emergency Medicine

## 2023-08-18 ENCOUNTER — Emergency Department (HOSPITAL_COMMUNITY): Payer: Medicare HMO

## 2023-08-18 ENCOUNTER — Encounter (HOSPITAL_COMMUNITY): Payer: Self-pay

## 2023-08-18 DIAGNOSIS — R079 Chest pain, unspecified: Secondary | ICD-10-CM

## 2023-08-18 LAB — COMPREHENSIVE METABOLIC PANEL
ALT: 34 U/L (ref 0–44)
AST: 41 U/L (ref 15–41)
Albumin: 4.2 g/dL (ref 3.5–5.0)
Alkaline Phosphatase: 92 U/L (ref 38–126)
Anion gap: 13 (ref 5–15)
BUN: 38 mg/dL — ABNORMAL HIGH (ref 8–23)
CO2: 20 mmol/L — ABNORMAL LOW (ref 22–32)
Calcium: 9.1 mg/dL (ref 8.9–10.3)
Chloride: 107 mmol/L (ref 98–111)
Creatinine, Ser: 1.61 mg/dL — ABNORMAL HIGH (ref 0.61–1.24)
GFR, Estimated: 47 mL/min — ABNORMAL LOW (ref >60)
Glucose, Bld: 93 mg/dL (ref 70–99)
Potassium: 3.8 mmol/L (ref 3.5–5.1)
Sodium: 140 mmol/L (ref 135–145)
Total Bilirubin: 0.5 mg/dL (ref 0.0–1.2)
Total Protein: 7.4 g/dL (ref 6.5–8.1)

## 2023-08-18 LAB — CBC WITH DIFFERENTIAL/PLATELET
Abs Immature Granulocytes: 0.04 10*3/uL (ref 0.00–0.07)
Basophils Absolute: 0.1 10*3/uL (ref 0.0–0.1)
Basophils Relative: 1 %
Eosinophils Absolute: 0.5 10*3/uL (ref 0.0–0.5)
Eosinophils Relative: 6 %
HCT: 42.8 % (ref 39.0–52.0)
Hemoglobin: 14 g/dL (ref 13.0–17.0)
Immature Granulocytes: 1 %
Lymphocytes Relative: 36 %
Lymphs Abs: 2.8 10*3/uL (ref 0.7–4.0)
MCH: 33.7 pg (ref 26.0–34.0)
MCHC: 32.7 g/dL (ref 30.0–36.0)
MCV: 102.9 fL — ABNORMAL HIGH (ref 80.0–100.0)
Monocytes Absolute: 0.7 10*3/uL (ref 0.1–1.0)
Monocytes Relative: 9 %
Neutro Abs: 3.7 10*3/uL (ref 1.7–7.7)
Neutrophils Relative %: 47 %
Platelets: 238 10*3/uL (ref 150–400)
RBC: 4.16 MIL/uL — ABNORMAL LOW (ref 4.22–5.81)
RDW: 12.7 % (ref 11.5–15.5)
WBC: 7.8 10*3/uL (ref 4.0–10.5)
nRBC: 0 % (ref 0.0–0.2)

## 2023-08-18 LAB — TROPONIN I (HIGH SENSITIVITY): Troponin I (High Sensitivity): 3 ng/L (ref <18)
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
## 2023-08-18 NOTE — Discharge Instructions (Signed)
You were evaluated in the Emergency Department and after careful evaluation, we did not find any emergent condition requiring admission or further testing in the hospital.  Your exam/testing today is overall reassuring.  Labs did not show any signs of heart damage, suspect muscular cause of your pain, recommend Tylenol or home Celebrex for discomfort, follow-up with your regular doctors.  Please return to the Emergency Department if you experience any worsening of your condition.   Thank you for allowing [place] to be a part of your care.

## 2024-06-09 ENCOUNTER — Ambulatory Visit (INDEPENDENT_AMBULATORY_CARE_PROVIDER_SITE_OTHER): Admitting: Otolaryngology

## 2024-06-09 ENCOUNTER — Encounter (INDEPENDENT_AMBULATORY_CARE_PROVIDER_SITE_OTHER): Payer: Self-pay | Admitting: Otolaryngology

## 2024-06-09 VITALS — BP 130/79 | HR 68 | Temp 97.8°F | Ht 63.0 in | Wt 160.0 lb

## 2024-06-09 DIAGNOSIS — J309 Allergic rhinitis, unspecified: Secondary | ICD-10-CM

## 2024-06-09 DIAGNOSIS — J3489 Other specified disorders of nose and nasal sinuses: Secondary | ICD-10-CM | POA: Diagnosis not present

## 2024-06-09 DIAGNOSIS — J310 Chronic rhinitis: Secondary | ICD-10-CM | POA: Insufficient documentation

## 2024-06-09 MED ORDER — FLUTICASONE PROPIONATE 50 MCG/ACT NA SUSP: 2.0000 | Freq: Every day | NASAL | 10 refills | Status: AC

## 2024-06-09 NOTE — Progress Notes (Signed)
 Patient ID: [name], male   DOB: 1956/03/31, 68 y.o.   MRN: [id number]  Follow up: Nasal congestion, history of nasal fractures  Discussed the use of AI scribe software for clinical note transcription with the patient, who gave verbal consent to proceed.  History of Present Illness [name] is a 68 year old male with a history of septal fracture who presents with nasal congestion and concerns about nasal healing.  Following his septal fracture operation, the patient continues to have chronic nasal congestion and raw sensation in his nose.  He has a septal perforation with some crusting and a history of previous cocaine use. He experiences nasal congestion and takes a generic Zyrtec every morning for allergies. He does not use nasal sprays like Flonase  or Nasonex.  He recently discovered he has COPD and has a history of smoking. He is concerned about making his ear passage smaller and worsening his nasal congestion.  Exam: General: Communicates without difficulty, well nourished, no acute distress. Head: Normocephalic, no evidence injury, no tenderness, facial buttresses intact without stepoff. Face/sinus: No tenderness to palpation and percussion. Facial movement is normal and symmetric. Eyes: PERRL, EOMI. No scleral icterus, conjunctivae clear. Neuro: CN II exam reveals vision grossly intact.  No nystagmus at any point of gaze. Ears: Auricles well formed without lesions.  Ear canals are intact without mass or lesion.  No erythema or edema is appreciated.  The TMs are intact without fluid. Nose: External evaluation reveals normal support and skin without lesions.  Dorsum is intact.  Anterior rhinoscopy reveals congested mucosa and a large septal perforation.  Oral:  Oral cavity and oropharynx are intact, symmetric, without erythema or edema.  Mucosa is moist without lesions. Neck: Full range of motion without pain.  There is no significant lymphadenopathy.  No masses palpable.  Thyroid  bed within normal limits to palpation.  Parotid glands and submandibular glands equal bilaterally without mass.  Trachea is midline. Neuro:  CN 2-12 grossly intact.    Assessment and Plan Assessment & Plan Nasal septal perforation with crusting Septal perforation with crusting, likely secondary to previous cocaine use. Nasal mucosa is completely healed with no raw areas. Crusting is present around the septal perforation. No infection or significant scarring noted. - Recommended saline irrigation to help with crusting and dryness.  Nasal congestion likely due to allergic rhinitis Nasal congestion likely due to allergic rhinitis, exacerbated by fall allergy season. No current use of nasal spray. Congestion is not related to nasal fracture or septal perforation. - Prescribed Flonase  nasal spray, two sprays in each nostril daily, pointing towards the ear on the same side.
